# Patient Record
Sex: Male | Born: 1975
Health system: Southern US, Community
[De-identification: ages and names within clinical notes are randomized; demographics above are authoritative.]

## PROBLEM LIST (undated history)

## (undated) DIAGNOSIS — F32A Depression, unspecified: Secondary | ICD-10-CM

## (undated) DIAGNOSIS — F4310 Post-traumatic stress disorder, unspecified: Secondary | ICD-10-CM

## (undated) DIAGNOSIS — S0990XA Unspecified injury of head, initial encounter: Secondary | ICD-10-CM

## (undated) DIAGNOSIS — R109 Unspecified abdominal pain: Secondary | ICD-10-CM

## (undated) DIAGNOSIS — K219 Gastro-esophageal reflux disease without esophagitis: Secondary | ICD-10-CM

## (undated) DIAGNOSIS — G43909 Migraine, unspecified, not intractable, without status migrainosus: Secondary | ICD-10-CM

## (undated) DIAGNOSIS — R413 Other amnesia: Secondary | ICD-10-CM

## (undated) DIAGNOSIS — F329 Major depressive disorder, single episode, unspecified: Secondary | ICD-10-CM

## (undated) HISTORY — DX: Unspecified injury of head, initial encounter: S09.90XA

## (undated) HISTORY — DX: Migraine, unspecified, not intractable, without status migrainosus: G43.909

## (undated) HISTORY — DX: Major depressive disorder, single episode, unspecified: F32.9

## (undated) HISTORY — DX: Gastro-esophageal reflux disease without esophagitis: K21.9

## (undated) HISTORY — DX: Unspecified abdominal pain: R10.9

## (undated) HISTORY — DX: Post-traumatic stress disorder, unspecified: F43.10

## (undated) HISTORY — PX: UPPER GI ENDOSCOPY: SHX6162

## (undated) HISTORY — PX: DIAGNOSTIC LAPAROSCOPY: SUR761

## (undated) HISTORY — PX: COLONOSCOPY: SHX174

## (undated) HISTORY — DX: Depression, unspecified: F32.A

## (undated) HISTORY — DX: Other amnesia: R41.3

---

## 2011-07-23 DIAGNOSIS — R413 Other amnesia: Secondary | ICD-10-CM

## 2011-07-23 HISTORY — DX: Other amnesia: R41.3

## 2011-12-21 HISTORY — PX: ABDOMINAL SURGERY: SHX537

## 2013-03-22 ENCOUNTER — Emergency Department: Payer: Self-pay | Admitting: Emergency Medicine

## 2013-06-24 ENCOUNTER — Ambulatory Visit: Payer: Self-pay | Admitting: Gastroenterology

## 2013-06-28 ENCOUNTER — Ambulatory Visit: Payer: Self-pay | Admitting: Gastroenterology

## 2013-06-29 LAB — PATHOLOGY REPORT

## 2013-07-05 ENCOUNTER — Ambulatory Visit: Payer: Self-pay | Admitting: Gastroenterology

## 2013-07-29 DIAGNOSIS — N281 Cyst of kidney, acquired: Secondary | ICD-10-CM | POA: Insufficient documentation

## 2013-08-02 ENCOUNTER — Encounter: Payer: Self-pay | Admitting: *Deleted

## 2013-08-16 ENCOUNTER — Encounter: Payer: Self-pay | Admitting: General Surgery

## 2013-08-16 ENCOUNTER — Ambulatory Visit (INDEPENDENT_AMBULATORY_CARE_PROVIDER_SITE_OTHER): Payer: Medicaid Other | Admitting: General Surgery

## 2013-08-16 VITALS — BP 140/100 | HR 80 | Resp 14 | Ht 67.0 in | Wt 227.0 lb

## 2013-08-16 DIAGNOSIS — K458 Other specified abdominal hernia without obstruction or gangrene: Secondary | ICD-10-CM

## 2013-08-16 DIAGNOSIS — R109 Unspecified abdominal pain: Secondary | ICD-10-CM

## 2013-08-16 NOTE — Patient Instructions (Signed)
Patient to return as needed. Patient to be referred to Dr. Sherren Mocha Heniford.
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
## 2013-08-17 DIAGNOSIS — K458 Other specified abdominal hernia without obstruction or gangrene: Secondary | ICD-10-CM | POA: Insufficient documentation

## 2013-08-18 ENCOUNTER — Encounter: Payer: Self-pay | Admitting: *Deleted

## 2013-08-18 NOTE — Progress Notes (Signed)
Patient ID: Cody Harrison, male   DOB: May 05, 1976, 38 y.o.   MRN: [id number]  Patient has been scheduled for an appointment with Dr. Cheryln Manly at [facility] [phone]) for 09-01-13 at 10:30 am. Per Kenney Houseman at their office, patient has been seen by Dr. Milas Gain before (that physician works right with Dr. Sherren Mocha Henoford and may even help consult with the patient). Records have been forwarded for their review.   Kenney Houseman will contact the patient with date, time, and instructions. She will also ask patient to obtain a disc from CT scan and MRI that he had completed through [facility] in December.

## 2015-02-23 DIAGNOSIS — E669 Obesity, unspecified: Secondary | ICD-10-CM | POA: Diagnosis not present

## 2015-02-23 DIAGNOSIS — Z87828 Personal history of other (healed) physical injury and trauma: Secondary | ICD-10-CM | POA: Diagnosis not present

## 2015-02-23 DIAGNOSIS — Z6838 Body mass index (BMI) 38.0-38.9, adult: Secondary | ICD-10-CM | POA: Diagnosis not present

## 2015-02-23 DIAGNOSIS — I10 Essential (primary) hypertension: Secondary | ICD-10-CM | POA: Diagnosis not present

## 2015-02-23 DIAGNOSIS — K439 Ventral hernia without obstruction or gangrene: Secondary | ICD-10-CM | POA: Diagnosis not present

## 2015-02-26 DIAGNOSIS — K439 Ventral hernia without obstruction or gangrene: Secondary | ICD-10-CM | POA: Insufficient documentation

## 2015-02-26 DIAGNOSIS — E669 Obesity, unspecified: Secondary | ICD-10-CM | POA: Insufficient documentation

## 2016-03-28 ENCOUNTER — Encounter (HOSPITAL_COMMUNITY): Payer: Self-pay | Admitting: Family Medicine

## 2016-03-28 ENCOUNTER — Emergency Department (HOSPITAL_COMMUNITY)
Admission: EM | Admit: 2016-03-28 | Discharge: 2016-03-28 | Disposition: A | Discharge status: Home / Self Care | Payer: Medicare Other | Attending: Emergency Medicine | Admitting: Emergency Medicine

## 2016-03-28 DIAGNOSIS — Z5321 Procedure and treatment not carried out due to patient leaving prior to being seen by health care provider: Secondary | ICD-10-CM | POA: Insufficient documentation

## 2016-03-28 DIAGNOSIS — K469 Unspecified abdominal hernia without obstruction or gangrene: Secondary | ICD-10-CM | POA: Insufficient documentation

## 2016-03-28 LAB — COMPREHENSIVE METABOLIC PANEL
ALK PHOS: 86 U/L (ref 38–126)
ALT: 25 U/L (ref 17–63)
ANION GAP: 8 (ref 5–15)
AST: 21 U/L (ref 15–41)
Albumin: 3.9 g/dL (ref 3.5–5.0)
BILIRUBIN TOTAL: 0.9 mg/dL (ref 0.3–1.2)
BUN: 8 mg/dL (ref 6–20)
CALCIUM: 9.5 mg/dL (ref 8.9–10.3)
CO2: 24 mmol/L (ref 22–32)
CREATININE: 0.78 mg/dL (ref 0.61–1.24)
Chloride: 105 mmol/L (ref 101–111)
GFR calc non Af Amer: >60 mL/min (ref >60)
GLUCOSE: 95 mg/dL (ref 65–99)
Potassium: 4.2 mmol/L (ref 3.5–5.1)
Sodium: 137 mmol/L (ref 135–145)
TOTAL PROTEIN: 7.6 g/dL (ref 6.5–8.1)

## 2016-03-28 LAB — CBC
HCT: 48.4 % (ref 39.0–52.0)
HEMOGLOBIN: 17.1 g/dL — AB (ref 13.0–17.0)
MCH: 31.4 pg (ref 26.0–34.0)
MCHC: 35.3 g/dL (ref 30.0–36.0)
MCV: 89 fL (ref 78.0–100.0)
PLATELETS: 228 10*3/uL (ref 150–400)
RBC: 5.44 MIL/uL (ref 4.22–5.81)
RDW: 13 % (ref 11.5–15.5)
WBC: 10.3 10*3/uL (ref 4.0–10.5)

## 2016-03-28 LAB — LIPASE, BLOOD: Lipase: 28 U/L (ref 11–51)

## 2016-03-28 LAB — URINALYSIS, ROUTINE W REFLEX MICROSCOPIC
BILIRUBIN URINE: NEGATIVE
Glucose, UA: NEGATIVE mg/dL
Hgb urine dipstick: NEGATIVE
KETONES UR: NEGATIVE mg/dL
Leukocytes, UA: NEGATIVE
NITRITE: NEGATIVE
PROTEIN: NEGATIVE mg/dL
SPECIFIC GRAVITY, URINE: 1.022 (ref 1.005–1.030)
pH: 6.5 (ref 5.0–8.0)

## 2016-03-28 NOTE — ED Triage Notes (Signed)
Pt presents from home via POV with c/o hernias with significant pain worsening over the last 6-64mos.  Hernias are located in Left abdomen and groin and right abdomen and groin.  Tender to palpation, reports abdomen is distended.

## 2016-03-28 NOTE — ED Notes (Signed)
Pt choosing to leave, updated on wait, plan and process with rationale. Encouraged to "stay, and/or return if: changes mind, or develops new or worsening sx". States, "Will return in the morning". Pt alert, NAD, calm, interactive, resps e/u, no dyspnea noted. Steady gait.

## 2016-03-29 ENCOUNTER — Emergency Department
Admission: EM | Admit: 2016-03-29 | Discharge: 2016-03-29 | Disposition: A | Discharge status: Home / Self Care | Payer: Medicare Other | Attending: Emergency Medicine | Admitting: Emergency Medicine

## 2016-03-29 ENCOUNTER — Encounter: Payer: Self-pay | Admitting: Radiology

## 2016-03-29 ENCOUNTER — Emergency Department: Payer: Medicare Other

## 2016-03-29 DIAGNOSIS — R1030 Lower abdominal pain, unspecified: Secondary | ICD-10-CM

## 2016-03-29 DIAGNOSIS — K460 Unspecified abdominal hernia with obstruction, without gangrene: Secondary | ICD-10-CM | POA: Insufficient documentation

## 2016-03-29 DIAGNOSIS — R110 Nausea: Secondary | ICD-10-CM | POA: Insufficient documentation

## 2016-03-29 DIAGNOSIS — K469 Unspecified abdominal hernia without obstruction or gangrene: Secondary | ICD-10-CM | POA: Diagnosis not present

## 2016-03-29 DIAGNOSIS — F17220 Nicotine dependence, chewing tobacco, uncomplicated: Secondary | ICD-10-CM | POA: Insufficient documentation

## 2016-03-29 DIAGNOSIS — R1032 Left lower quadrant pain: Secondary | ICD-10-CM | POA: Diagnosis not present

## 2016-03-29 LAB — URINALYSIS COMPLETE WITH MICROSCOPIC (ARMC ONLY)
BACTERIA UA: NONE SEEN
BILIRUBIN URINE: NEGATIVE
Glucose, UA: NEGATIVE mg/dL
HGB URINE DIPSTICK: NEGATIVE
Ketones, ur: NEGATIVE mg/dL
Leukocytes, UA: NEGATIVE
Nitrite: NEGATIVE
PH: 6 (ref 5.0–8.0)
Protein, ur: NEGATIVE mg/dL
SQUAMOUS EPITHELIAL / LPF: NONE SEEN
Specific Gravity, Urine: 1.019 (ref 1.005–1.030)
WBC, UA: NONE SEEN WBC/hpf (ref 0–5)

## 2016-03-29 LAB — CBC
HEMATOCRIT: 47.8 % (ref 40.0–52.0)
Hemoglobin: 16.8 g/dL (ref 13.0–18.0)
MCH: 31.1 pg (ref 26.0–34.0)
MCHC: 35.1 g/dL (ref 32.0–36.0)
MCV: 88.7 fL (ref 80.0–100.0)
PLATELETS: 227 10*3/uL (ref 150–440)
RBC: 5.39 MIL/uL (ref 4.40–5.90)
RDW: 13.2 % (ref 11.5–14.5)
WBC: 10.4 10*3/uL (ref 3.8–10.6)

## 2016-03-29 LAB — COMPREHENSIVE METABOLIC PANEL
ALT: 22 U/L (ref 17–63)
AST: 22 U/L (ref 15–41)
Albumin: 4 g/dL (ref 3.5–5.0)
Alkaline Phosphatase: 84 U/L (ref 38–126)
Anion gap: 7 (ref 5–15)
BUN: 13 mg/dL (ref 6–20)
CHLORIDE: 105 mmol/L (ref 101–111)
CO2: 24 mmol/L (ref 22–32)
CREATININE: 0.74 mg/dL (ref 0.61–1.24)
Calcium: 9.2 mg/dL (ref 8.9–10.3)
GFR calc Af Amer: >60 mL/min (ref >60)
GFR calc non Af Amer: >60 mL/min (ref >60)
Glucose, Bld: 118 mg/dL — ABNORMAL HIGH (ref 65–99)
POTASSIUM: 4.1 mmol/L (ref 3.5–5.1)
SODIUM: 136 mmol/L (ref 135–145)
Total Bilirubin: 0.6 mg/dL (ref 0.3–1.2)
Total Protein: 7.7 g/dL (ref 6.5–8.1)

## 2016-03-29 LAB — LIPASE, BLOOD: LIPASE: 63 U/L — AB (ref 11–51)

## 2016-03-29 MED ORDER — METOCLOPRAMIDE HCL 10 MG PO TABS: 10.0000 mg | ORAL_TABLET | Freq: Four times a day (QID) | ORAL | 0 refills | Status: DC | PRN

## 2016-03-29 MED ORDER — IOPAMIDOL (ISOVUE-300) INJECTION 61%
100.0000 mL | Freq: Once | INTRAVENOUS | Status: AC | PRN
  Administered 2016-03-29: 100 mL via INTRAVENOUS
  Filled 2016-03-29: qty 100

## 2016-03-29 MED ORDER — ONDANSETRON HCL 4 MG/2ML IJ SOLN
4.0000 mg | Freq: Once | INTRAMUSCULAR | Status: AC
  Administered 2016-03-29: 4 mg via INTRAVENOUS
  Filled 2016-03-29: qty 2

## 2016-03-29 MED ORDER — SODIUM CHLORIDE 0.9 % IV BOLUS (SEPSIS)
1000.0000 mL | Freq: Once | INTRAVENOUS | Status: AC
  Administered 2016-03-29: 1000 mL via INTRAVENOUS

## 2016-03-29 MED ORDER — IOPAMIDOL (ISOVUE-300) INJECTION 61%
30.0000 mL | Freq: Once | INTRAVENOUS | Status: AC | PRN
  Administered 2016-03-29: 30 mL via ORAL
  Filled 2016-03-29: qty 30

## 2016-03-29 NOTE — ED Notes (Signed)

## 2016-03-29 NOTE — ED Triage Notes (Signed)
Presents with family from home with c/o hernias  For the past 6 mos or so   States pain is worsening to left side of abd   Positive nausea  No vomiting  Or fever
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
## 2016-03-29 NOTE — ED Notes (Signed)
Patient transported to CT 

## 2016-04-04 ENCOUNTER — Ambulatory Visit (INDEPENDENT_AMBULATORY_CARE_PROVIDER_SITE_OTHER): Payer: Medicare Other | Admitting: General Surgery

## 2016-04-04 ENCOUNTER — Telehealth: Payer: Self-pay

## 2016-04-04 ENCOUNTER — Encounter: Payer: Self-pay | Admitting: General Surgery

## 2016-04-04 VITALS — BP 150/83 | HR 66 | Temp 97.8°F | Ht 67.0 in | Wt 248.2 lb

## 2016-04-04 DIAGNOSIS — N281 Cyst of kidney, acquired: Secondary | ICD-10-CM

## 2016-04-04 DIAGNOSIS — K458 Other specified abdominal hernia without obstruction or gangrene: Secondary | ICD-10-CM

## 2016-04-04 DIAGNOSIS — Q6100 Congenital renal cyst, unspecified: Secondary | ICD-10-CM | POA: Diagnosis not present

## 2016-04-04 MED ORDER — GABAPENTIN 300 MG PO CAPS: 300.0000 mg | ORAL_CAPSULE | Freq: Three times a day (TID) | ORAL | 0 refills | Status: DC
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
## 2016-04-08 NOTE — Telephone Encounter (Signed)
I have sent both referrals through [facility].   I have contacted the pain clinic and they are scheduling out New Patient's in December.   I will follow up on appointments.

## 2016-04-11 ENCOUNTER — Encounter: Payer: Self-pay | Admitting: Urology

## 2016-04-11 ENCOUNTER — Ambulatory Visit (INDEPENDENT_AMBULATORY_CARE_PROVIDER_SITE_OTHER): Payer: Medicare Other | Admitting: Urology

## 2016-04-11 VITALS — BP 132/85 | HR 63 | Ht 67.0 in | Wt 248.0 lb

## 2016-04-11 DIAGNOSIS — N2889 Other specified disorders of kidney and ureter: Secondary | ICD-10-CM
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
## 2016-04-23 ENCOUNTER — Ambulatory Visit (INDEPENDENT_AMBULATORY_CARE_PROVIDER_SITE_OTHER): Payer: Medicare Other | Admitting: Family Medicine

## 2016-04-23 ENCOUNTER — Encounter: Payer: Self-pay | Admitting: Family Medicine

## 2016-04-23 VITALS — BP 124/74 | HR 80 | Temp 98.3°F | Resp 16 | Ht 67.0 in | Wt 245.8 lb

## 2016-04-23 DIAGNOSIS — Z23 Encounter for immunization: Secondary | ICD-10-CM | POA: Diagnosis not present

## 2016-04-23 DIAGNOSIS — D492 Neoplasm of unspecified behavior of bone, soft tissue, and skin: Secondary | ICD-10-CM | POA: Insufficient documentation

## 2016-04-23 DIAGNOSIS — E669 Obesity, unspecified: Secondary | ICD-10-CM

## 2016-04-23 DIAGNOSIS — G8928 Other chronic postprocedural pain: Secondary | ICD-10-CM | POA: Insufficient documentation

## 2016-04-23 DIAGNOSIS — G4700 Insomnia, unspecified: Secondary | ICD-10-CM | POA: Insufficient documentation

## 2016-04-23 DIAGNOSIS — R7309 Other abnormal glucose: Secondary | ICD-10-CM

## 2016-04-23 DIAGNOSIS — Z13220 Encounter for screening for lipoid disorders: Secondary | ICD-10-CM

## 2016-04-23 DIAGNOSIS — I10 Essential (primary) hypertension: Secondary | ICD-10-CM | POA: Insufficient documentation

## 2016-04-23 DIAGNOSIS — F4310 Post-traumatic stress disorder, unspecified: Secondary | ICD-10-CM

## 2016-04-23 DIAGNOSIS — Z131 Encounter for screening for diabetes mellitus: Secondary | ICD-10-CM | POA: Diagnosis not present

## 2016-04-23 DIAGNOSIS — D229 Melanocytic nevi, unspecified: Secondary | ICD-10-CM

## 2016-04-23 DIAGNOSIS — F418 Other specified anxiety disorders: Secondary | ICD-10-CM

## 2016-04-23 DIAGNOSIS — G4701 Insomnia due to medical condition: Secondary | ICD-10-CM

## 2016-04-23 DIAGNOSIS — R030 Elevated blood-pressure reading, without diagnosis of hypertension: Secondary | ICD-10-CM | POA: Insufficient documentation

## 2016-04-23 DIAGNOSIS — K439 Ventral hernia without obstruction or gangrene: Secondary | ICD-10-CM

## 2016-04-23 DIAGNOSIS — F1021 Alcohol dependence, in remission: Secondary | ICD-10-CM

## 2016-04-23 MED ORDER — TRAZODONE HCL 150 MG PO TABS: 150.0000 mg | ORAL_TABLET | Freq: Every day | ORAL | 2 refills | Status: DC

## 2016-04-23 MED ORDER — BACLOFEN 10 MG PO TABS: 5.0000 mg | ORAL_TABLET | Freq: Three times a day (TID) | ORAL | 2 refills | Status: DC | PRN

## 2016-04-23 MED ORDER — CITALOPRAM HYDROBROMIDE 40 MG PO TABS: 40.0000 mg | ORAL_TABLET | Freq: Every day | ORAL | 2 refills | Status: DC

## 2016-04-23 NOTE — Assessment & Plan Note (Signed)
Elevated BMI, and prior abnormal glucose. Fam history DM. Chronic HTN vs Pre-HTN Future fasting lipid ordered and A1c screening.

## 2016-04-23 NOTE — Assessment & Plan Note (Signed)
Stable, post-op incisional hernia abdominal wall. Previously followed by various Bariatric surgeons and General Surgery, no plans for surgery at this time. No indications of worsening or new problem. Follow-up as needed.

## 2016-04-23 NOTE — Patient Instructions (Signed)
Thank you for coming in to clinic today.  1. For Chronic Pain - Start taking Gabapentin 300mg  capsules - twice a day for several days, then if tolerated and not too sedated, increase to 1 capsules 3 times a day - You may follow-up with Dr Adonis Huguenin if you want  Still pending referral to Dr Milinda Pointer, MD - call their office to find out more info, this takes a while usually [facility] [address], [place], [place] [address] Main: [phone]  Recommend to start taking Tylenol Extra Strength 500mg  tabs - take 1 to 2 tabs (max 1000mg  per dose) every 6 hours for pain (take regularly, don't skip a dose for next 3-7 days), max 24 hour daily dose is 6 to 8 tablets or 3000 to 4000mg   Start taking Baclofen (Lioresal) 10mg  (muscle relaxant) - start with half to one pill at night as needed for next 1-3 nights (may make you drowsy, caution with driving) see how it affects you, then if tolerated increase to one pill 2 to 3 times a day or (every 8 hours as needed)  May try topical icy hot or other medications, heating pad or ice packs  2. Blood pressure is okay today, no new medications. - Check outside BP at pharmacy or drug store or at home, write down readings and bring to next visit  3. Referral to Dermatology for skin tag removal and mole evaluation  [facility] - Dr. Ree Edman   [address] [place], [place] [address] Phone: [phone]  Received TDap and FLu shots today  In next 2-3 weeks... - Please schedule a "Lab Only" visit (early morning, 8:00am to 9:00am) to get your blood work drawn here at our clinic. - You need to be fasting (No food or drink after midnight, and nothing in the morning before your blood draw). - I have already ordered your blood work, so you may schedule this appointment at your convenience  For Lab Results, once available within next 2-3 days, you can log in to [facility] to  view your results and a brief message with explanations. Otherwise, our office will contact you by phone within 1 week. OR we can discuss results at your next follow-up visit.   Please schedule a follow-up appointment with Dr. Parks Ranger in 4 to 6 weeks to follow-up BP, DM screening, Chronic Pain, Depression/Anxiety  If you have any other questions or concerns, please feel free to call the clinic or send a message through [place]. You may also schedule an earlier appointment if necessary.  Nobie Putnam, DO [place]
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
## 2016-04-23 NOTE — Assessment & Plan Note (Signed)
R axilla/chest wall, appears almost wart-like vs pedunculated growth. Abnormal appearance at tip with perhaps vasculature. - Referral to Dermatology for excision and pathology
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
## 2016-04-23 NOTE — Assessment & Plan Note (Signed)
Initial borderline elevated, prior mild elevated values. Re-check improved. No prior anti-HTN therapy Hold new start med today. Advised to monitor BP outside office, record readings, bring to next visit. Consider low dose med in near future.

## 2016-04-23 NOTE — Assessment & Plan Note (Signed)
Stable, sober from alcohol >2 months, s/p inpatient medical detox. No history of complicated withdrawal symptoms. Encouraged to stay alcohol free, has good support Follow-up as needed

## 2016-04-23 NOTE — Assessment & Plan Note (Signed)
Two atypical moles on mid back Referral to Dermatology, given other skin abnormalities

## 2016-04-23 NOTE — Assessment & Plan Note (Signed)
Secondary to anxiety/depression also chronic post-op pain. Expect improvement now sober from alcohol x 2 months, may take time. Increase Trazodone from 100 to 150mg  at night for now. Continue Celexa and treat underlying pain.

## 2016-05-09 ENCOUNTER — Other Ambulatory Visit: Payer: Self-pay | Admitting: *Deleted

## 2016-05-09 ENCOUNTER — Other Ambulatory Visit: Payer: Medicare Other

## 2016-05-09 DIAGNOSIS — E669 Obesity, unspecified: Secondary | ICD-10-CM | POA: Diagnosis not present

## 2016-05-09 DIAGNOSIS — R7309 Other abnormal glucose: Secondary | ICD-10-CM

## 2016-05-09 DIAGNOSIS — Z13220 Encounter for screening for lipoid disorders: Secondary | ICD-10-CM | POA: Diagnosis not present

## 2016-05-09 DIAGNOSIS — Z131 Encounter for screening for diabetes mellitus: Secondary | ICD-10-CM

## 2016-05-10 ENCOUNTER — Encounter: Payer: Self-pay | Admitting: Family Medicine

## 2016-05-10 DIAGNOSIS — E1169 Type 2 diabetes mellitus with other specified complication: Secondary | ICD-10-CM | POA: Insufficient documentation

## 2016-05-10 DIAGNOSIS — R7303 Prediabetes: Secondary | ICD-10-CM | POA: Insufficient documentation

## 2016-05-10 LAB — LIPID PANEL
Cholesterol: 211 mg/dL — ABNORMAL HIGH (ref 125–200)
HDL: 39 mg/dL — AB (ref >=40)
LDL CALC: 139 mg/dL — AB (ref <130)
Total CHOL/HDL Ratio: 5.4 Ratio — ABNORMAL HIGH (ref <=5.0)
Triglycerides: 164 mg/dL — ABNORMAL HIGH (ref <150)
VLDL: 33 mg/dL — ABNORMAL HIGH (ref <30)

## 2016-05-10 LAB — HEMOGLOBIN A1C
HEMOGLOBIN A1C: 6.3 % — AB (ref <5.7)
Mean Plasma Glucose: 134 mg/dL

## 2016-05-28 ENCOUNTER — Encounter: Payer: Self-pay | Admitting: Family Medicine

## 2016-05-28 ENCOUNTER — Ambulatory Visit (INDEPENDENT_AMBULATORY_CARE_PROVIDER_SITE_OTHER): Payer: Medicare Other | Admitting: Family Medicine

## 2016-05-28 VITALS — BP 138/69 | HR 74 | Temp 98.6°F | Resp 16 | Ht 67.0 in

## 2016-05-28 DIAGNOSIS — I10 Essential (primary) hypertension: Secondary | ICD-10-CM | POA: Diagnosis not present

## 2016-05-28 DIAGNOSIS — F418 Other specified anxiety disorders: Secondary | ICD-10-CM | POA: Diagnosis not present

## 2016-05-28 DIAGNOSIS — R7303 Prediabetes: Secondary | ICD-10-CM | POA: Diagnosis not present

## 2016-05-28 DIAGNOSIS — G8928 Other chronic postprocedural pain: Secondary | ICD-10-CM | POA: Diagnosis not present

## 2016-05-28 DIAGNOSIS — F4310 Post-traumatic stress disorder, unspecified: Secondary | ICD-10-CM

## 2016-05-28 MED ORDER — METFORMIN HCL 500 MG PO TABS: 500.0000 mg | ORAL_TABLET | Freq: Two times a day (BID) | ORAL | 2 refills | Status: DC

## 2016-05-28 MED ORDER — DULOXETINE HCL 30 MG PO CPEP: 30.0000 mg | ORAL_CAPSULE | Freq: Every day | ORAL | 1 refills | Status: DC

## 2016-05-28 MED ORDER — GABAPENTIN 600 MG PO TABS: 600.0000 mg | ORAL_TABLET | Freq: Three times a day (TID) | ORAL | 0 refills | Status: DC
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
## 2016-05-28 NOTE — Patient Instructions (Addendum)
Thank you for coming in to clinic today.  A1c 6.3, this is pre Diabetes - Start Metformin 500mg  with dinner for 1 week, it may cause upset stomach and diarrhea, you will eventually get used to this as body adjusts, it may take a few weeks though, once tolerated, you can increase to 1 pill twice a day with food. In future max dose is 2 pills twice a day, we will discuss this if we need to - Try to work on low carb diet / regular exercise  Taper off Celexa - take one whole tab 40mg  every OTHER day for next 1 week  Once off Celexa, start Duloxetine (we will work on [employer], they will likely give you a temporary 30 day supply first), then take this once daily every day for next 4 weeks  Discontinue Trazodone  For Chronic Pain - Resume gabapentin 300mg  in morning and afternoon, and take new rx Gabapentin 600mg  tab at NIGHT, do this for several days to 1 week if tolerated well, not too sedated, you can increase up to 300mg  morning, 600mg  afternoon and 600mg  night, eventually after 1-2 weeks work your way up to 600mg  tablet 3 times a day - Duloxetine (Cymbalta) should help with chronic pain and insomnia as well  Recommend to start taking Tylenol Extra Strength 500mg  tabs - take 1 to 2 tabs (max 1000mg  per dose) every 6 hours for pain (take regularly, don't skip a dose for next 3-7 days), max 24 hour daily dose is 6 to 8 tablets or 3000 to 4000mg   2. Blood pressure is okay today, no new medications. - Keep checking BP, few times a week is fine, plan to bring these readings and your cuff to your visit in 3 MONTHS, not next visit  Diet Recommendations for Pre / Diabetes   Starchy (carb) foods include: Bread, rice, pasta, potatoes, corn, crackers, bagels, muffins, all baked goods.   Protein foods include: Meat, fish, poultry, eggs, dairy foods, and beans such as pinto and kidney beans (beans also provide carbohydrate).   1. Eat at least 3 meals and 1-2 snacks per day. Never go  more than 4-5 hours while awake without eating.   2. Limit starchy foods to TWO per meal and ONE per snack. ONE portion of a starchy  food is equal to the following:   - ONE slice of bread (or its equivalent, such as half of a hamburger bun).   - 1/2 cup of a "scoopable" starchy food such as potatoes or rice.   - 1 OUNCE (28 grams) of starchy snacks (crackers or pretzels, look on label).   - 15 grams of carbohydrate as shown on food label.   3. Both lunch and dinner should include a protein food, a carb food, and vegetables.   - Obtain twice as many veg's as protein or carbohydrate foods for both lunch and dinner.   - Try to keep frozen veg's on hand for a quick vegetable serving.     - Fresh or frozen veg's are best.   4. Breakfast should always include protein.      Please schedule a follow-up appointment with Dr. Parks Ranger in 4 weeks to follow-up Anxiety/Depression Medication, and also 3 Months from today for Pre-Diabetes (A1c), BP  If you have any other questions or concerns, please feel free to call the clinic or send a message through [place]. You may also schedule an earlier appointment if necessary.  Nobie Putnam, DO [facility],  [facility]

## 2016-05-28 NOTE — Assessment & Plan Note (Signed)
BP within normal range today, concern with chronic pain raising BP and home BP checks elevated.  Plan: 1. Continue to hold off on new start anti-HTN therapy today 2. Monitor BP at home, write down logs, bring to visit in 3 months with BP cuff for calibration, discussed most likely will need to start low dose therapy, possibly ACEi with Pre-DM

## 2016-05-29 NOTE — Assessment & Plan Note (Signed)
New diagnosis Pre-DM Counseling on DM diet and lifestyle modifications Start Metformin titration 500mg  nightly 1 week or longer as tolerated (counseled on GI intolerance), goal up to 500mg  BID when follow-up Check A1c 3 months, future consider [place] for education
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
## 2016-05-29 NOTE — Assessment & Plan Note (Signed)
Stable, without worsening See A&P for anxiety/depression Taper off Celexa, start Duloxetine, may need to dose titrate next visit Follow-up 4 weeks, consider duloxetine 60, and psych

## 2016-06-25 ENCOUNTER — Encounter: Payer: Self-pay | Admitting: Family Medicine

## 2016-06-25 ENCOUNTER — Ambulatory Visit (INDEPENDENT_AMBULATORY_CARE_PROVIDER_SITE_OTHER): Payer: Medicare Other | Admitting: Family Medicine

## 2016-06-25 VITALS — BP 135/71 | HR 82 | Temp 97.9°F | Resp 16 | Ht 67.0 in | Wt 243.0 lb

## 2016-06-25 DIAGNOSIS — G4701 Insomnia due to medical condition: Secondary | ICD-10-CM

## 2016-06-25 DIAGNOSIS — R030 Elevated blood-pressure reading, without diagnosis of hypertension: Secondary | ICD-10-CM | POA: Diagnosis not present

## 2016-06-25 DIAGNOSIS — F1021 Alcohol dependence, in remission: Secondary | ICD-10-CM

## 2016-06-25 DIAGNOSIS — F4310 Post-traumatic stress disorder, unspecified: Secondary | ICD-10-CM | POA: Diagnosis not present

## 2016-06-25 DIAGNOSIS — G8928 Other chronic postprocedural pain: Secondary | ICD-10-CM | POA: Diagnosis not present

## 2016-06-25 DIAGNOSIS — R7303 Prediabetes: Secondary | ICD-10-CM | POA: Diagnosis not present

## 2016-06-25 DIAGNOSIS — F418 Other specified anxiety disorders: Secondary | ICD-10-CM | POA: Diagnosis not present

## 2016-06-25 MED ORDER — DULOXETINE HCL 60 MG PO CPEP: 60.0000 mg | ORAL_CAPSULE | Freq: Every day | ORAL | 5 refills | Status: DC

## 2016-06-25 MED ORDER — GABAPENTIN 600 MG PO TABS: 600.0000 mg | ORAL_TABLET | Freq: Three times a day (TID) | ORAL | 1 refills | Status: DC

## 2016-06-25 NOTE — Assessment & Plan Note (Signed)
Improved to stable, without worsening. See A&P Depression/Anxiety Off Celexa Increased Duloxetine to 60mg  daily

## 2016-06-25 NOTE — Assessment & Plan Note (Signed)
Slightly elevated SBP, only in Pre-HTN range, not consistent with HTN. Reviewed home BP log avg 120s/70s, not consistent with HTN  Plan: 1. Reassurance, unlikely dx HTN, likely Pre-HTN 2. No anti-HTN therapy today 3. Encouraged continue lifestyle improvements 4. Follow-up 6 weeks re-check, monitor BP outside, consider low dose ACEi if needed if any worsening blood sugar
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
## 2016-06-25 NOTE — Assessment & Plan Note (Addendum)
Improved on Duloxetine, secondary to anxiety/depression also chronic post-op pain. - Remains sober since 02/2016, also helping - Off trazodone, ineffective  Plan: 1. Increase Duloxetine to 60mg  daily 2. Follow-up
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
## 2016-06-25 NOTE — Assessment & Plan Note (Signed)
No change today, reviewed plan Continue Metformin 500 BID, since tolerating well, decision to not titrate at this time Encouraged lifestyle modifications Follow-up 6 weeks for Pre-DM A1c, consider future [place] referral if needed
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
## 2016-06-25 NOTE — Patient Instructions (Addendum)
Thank you for coming in to clinic today.  Continue same dose Metformin 500mg  twice a week with food. In future, if A1c is elevated then we can discuss increasing this dose as needed. - Keep up good work on low carb diet and regular exercise  I am encouraged that your symptoms are starting to improve with the Duloxetine, it may take 2 to 6 weeks or more to get even more benefit after changing dose  Increase Duloxetine to 60mg  daily - you can take 2 of the old 30mg  capsules, OR go ahead and start NEW rx Duloxetine 60mg  daily, given refills, this is max dose, stay at this dose once daily every day for next several months, we may need to continue this long-term if helping.  For Chronic Pain - Continue Gabapentin 600mg  3 times a day, slowly after next 1-2 weeks you can start increasing to ONE and  HALF tablets = 900mg  per dose, do this for NIGHT-time dose first, for several days, then slowly go up to 900mg  3 times a day - IF STILL NOT Helping then we will discuss switch to Lyrica in future  Recommend to start taking Tylenol Extra Strength 500mg  tabs - take 1 to 2 tabs (max 1000mg  per dose) every 6 hours for pain (take regularly, don't skip a dose for next 3-7 days), max 24 hour daily dose is 6 to 8 tablets or 3000 to 4000mg   Thanks for checking BP, readings look good. You do not have hypertension at this time, if you can check Bp at pharmacy whenever you go or whenever at [place], write these numbers down  Please schedule a follow-up appointment with Dr. Parks Ranger in 6 weeks after 08/09/16 for Pre-Diabetes re-check, Anxiety Depression GAD/PHQ  If you have any other questions or concerns, please feel free to call the clinic or send a message through [place]. You may also schedule an earlier appointment if necessary.  Nobie Putnam, DO [place]

## 2016-08-12 ENCOUNTER — Other Ambulatory Visit: Payer: Medicare Other

## 2016-08-12 ENCOUNTER — Other Ambulatory Visit (INDEPENDENT_AMBULATORY_CARE_PROVIDER_SITE_OTHER): Payer: Medicare Other | Admitting: Family Medicine

## 2016-08-12 DIAGNOSIS — R7303 Prediabetes: Secondary | ICD-10-CM

## 2016-08-12 LAB — HEMOGLOBIN A1C
HEMOGLOBIN A1C: 5.3 % (ref <5.7)
MEAN PLASMA GLUCOSE: 105 mg/dL

## 2016-08-13 LAB — POCT UA - MICROALBUMIN: Microalbumin Ur, POC: 0 mg/L

## 2016-08-14 ENCOUNTER — Encounter: Payer: Self-pay | Admitting: Family Medicine

## 2016-08-14 ENCOUNTER — Ambulatory Visit (INDEPENDENT_AMBULATORY_CARE_PROVIDER_SITE_OTHER): Payer: Medicare Other | Admitting: Family Medicine

## 2016-08-14 VITALS — BP 136/78 | HR 72 | Temp 97.7°F | Resp 16 | Ht 67.0 in | Wt 214.0 lb

## 2016-08-14 DIAGNOSIS — F4310 Post-traumatic stress disorder, unspecified: Secondary | ICD-10-CM | POA: Diagnosis not present

## 2016-08-14 DIAGNOSIS — R7303 Prediabetes: Secondary | ICD-10-CM

## 2016-08-14 DIAGNOSIS — G4701 Insomnia due to medical condition: Secondary | ICD-10-CM | POA: Diagnosis not present

## 2016-08-14 DIAGNOSIS — E669 Obesity, unspecified: Secondary | ICD-10-CM

## 2016-08-14 DIAGNOSIS — R030 Elevated blood-pressure reading, without diagnosis of hypertension: Secondary | ICD-10-CM

## 2016-08-14 DIAGNOSIS — F1021 Alcohol dependence, in remission: Secondary | ICD-10-CM

## 2016-08-14 DIAGNOSIS — F418 Other specified anxiety disorders: Secondary | ICD-10-CM | POA: Diagnosis not present

## 2016-08-14 MED ORDER — DULOXETINE HCL 60 MG PO CPEP: ORAL_CAPSULE | ORAL | 5 refills | Status: DC

## 2016-08-14 MED ORDER — DULOXETINE HCL 30 MG PO CPEP: ORAL_CAPSULE | ORAL | 5 refills | Status: DC

## 2016-08-14 NOTE — Patient Instructions (Signed)
Thank you for coming in to clinic today.  1. - Increase Duloxetine to total 90mg  nightly, take one of the 60mg  capsules and one of the 30mg  capsules same time every night - Let me know if difficulty obtaining both rx from pharmacy due to separate dosages, increase alternatively to TWO of the 60s and will send higher pill count - Discussed other options such as Remeron (Mirtazapine), Buspar (Buspirone), Wellbutrin, please look into these options in the future if needed as add on therapy  BP improved today, keep checking and write down readings, if persistently elevated >140/90 then contact office sooner and we can re-discuss  Congratulations on the low A1c and weight loss, keep up the good work - In future consider starting some regular exercise with walking or other to help control BP  Please call to give update in 4 weeks about the Duloxetine.  Please schedule a follow-up appointment with Dr. Parks Ranger in 6 months for Pre-Diabetes A1c check, or if worsening if you have weight gain or other problems, you can return sooner like 3 months   If you have any other questions or concerns, please feel free to call the clinic or send a message through [place]. You may also schedule an earlier appointment if necessary.  Nobie Putnam, DO [place]

## 2016-08-14 NOTE — Assessment & Plan Note (Addendum)
Initial BP elevated, repeat BP improved, SBP still slightly elevated. However home logs have been all < 140/90  Plan: 1. Reassurance still, monitor BP outside office, notify us if persistent elevated BP >140/90 2. Encouraged regular exercise in addition to continuing current healthier diet, limited soda. BP should continue to improve with weight loss 3. Follow-up 6 months given overall doing well, return sooner 3 mo if needed. Will discuss consider low dose ACEi in future
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
## 2016-08-14 NOTE — Assessment & Plan Note (Signed)
Significant improvement A1c 5.3 (from 6.3) with assoc 30 lb wt loss in 2 months due to improved lifestyle (limited soda) and metformin - Recent urine microalbumin negative (0)  Plan: 1. Continue Metformin 500mg  BID 2. Encouraged continue lifestyle changes with diet, limited soda, also encouraged to start some regular exercise to help with weight, control BP and sugar 3. Follow-up 6 months A1c, BP, consider adding low dose ACEi may help BP also renal protection despite normal kidney health currently. Consider future [facility]
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
## 2016-08-14 NOTE — Assessment & Plan Note (Signed)
Stable to improved. Increase Duloxetine from 60 to 90, see A&P

## 2016-08-14 NOTE — Assessment & Plan Note (Signed)
Stable, remains sober alcohol free since 02/2016 - Attributes some weight loss due to no more beer intake - Encouraged remain alcohol free

## 2016-08-14 NOTE — Assessment & Plan Note (Signed)
Stable to improved, still few nights weekly difficulty with insomnia, improved on higher dose self titrated duloxetine 60 to 120 - See A&P anxiety - Increase Duloxetine 60 to 90

## 2016-08-14 NOTE — Assessment & Plan Note (Signed)
Significant weight loss, 30 lb in 2 months, modified diet with limited soda, still no regular exercise. - Encouraged continue lifestyle, start regular exercise

## 2016-10-11 ENCOUNTER — Other Ambulatory Visit: Payer: Self-pay | Admitting: Family Medicine

## 2016-10-11 DIAGNOSIS — R7303 Prediabetes: Secondary | ICD-10-CM

## 2016-10-11 MED ORDER — METFORMIN HCL 500 MG PO TABS: 500.0000 mg | ORAL_TABLET | Freq: Two times a day (BID) | ORAL | 3 refills | Status: DC

## 2016-11-11 ENCOUNTER — Telehealth: Payer: Self-pay | Admitting: Family Medicine

## 2016-11-11 DIAGNOSIS — G8928 Other chronic postprocedural pain: Secondary | ICD-10-CM

## 2016-11-11 MED ORDER — GABAPENTIN 600 MG PO TABS: 600.0000 mg | ORAL_TABLET | Freq: Three times a day (TID) | ORAL | 11 refills | Status: DC

## 2016-11-11 NOTE — Telephone Encounter (Signed)
Pt needs a refill on gabapentin sent to [facility].  His call back number is [phone]

## 2016-11-11 NOTE — Telephone Encounter (Signed)
Refilled Gabapentin for dose 1 to 1.5 tabs of 600mg  TID, #135 tabs +11 refills to [facility] 11/11/2016, 1:51 PM

## 2017-03-12 ENCOUNTER — Encounter: Payer: Self-pay | Admitting: Family Medicine

## 2017-03-12 ENCOUNTER — Ambulatory Visit (INDEPENDENT_AMBULATORY_CARE_PROVIDER_SITE_OTHER): Payer: Medicare Other | Admitting: Family Medicine

## 2017-03-12 VITALS — BP 169/81 | HR 94 | Temp 98.6°F | Resp 16 | Ht 67.0 in | Wt 252.6 lb

## 2017-03-12 DIAGNOSIS — F4310 Post-traumatic stress disorder, unspecified: Secondary | ICD-10-CM | POA: Diagnosis not present

## 2017-03-12 DIAGNOSIS — F418 Other specified anxiety disorders: Secondary | ICD-10-CM

## 2017-03-12 DIAGNOSIS — G4701 Insomnia due to medical condition: Secondary | ICD-10-CM

## 2017-03-12 DIAGNOSIS — D492 Neoplasm of unspecified behavior of bone, soft tissue, and skin: Secondary | ICD-10-CM

## 2017-03-12 MED ORDER — QUETIAPINE FUMARATE 50 MG PO TABS: 25.0000 mg | ORAL_TABLET | Freq: Every day | ORAL | 1 refills | Status: DC

## 2017-03-12 NOTE — Patient Instructions (Addendum)
Thank you for coming to the clinic today.  1.  Start taking Seroquel 25mg  (HALF tab) nightly for now, after 1 week if not helping increase to 1 whole pill for 50mg  nightly, then after 2 more weeks if not helping can increase to 2 of the 50mg  pills for 100mg   Referred you to [facility] in [place], stay tuned for appointment  [facility] [place], [place] [address] Phone: [phone]  PSYCHIATRY / [place]  Self Referral  [facility] (All ages) [address], [place], [id number] Phone: [phone] (Option 1) [email or web address] Dr Myer Haff  [facility]) [place] [address], [place], [place] [address] Phone: [phone]  [employer], available walk-in 9am-4pm M-F [place], [place] [address] Hours: [place] (M-F, walk in available) [phone]   Address: [place], [place], [place] [address] Hours: 8AM-5PM (accepts walk in to establish) Phone: [phone]   Please schedule a Follow-up Appointment to: Return in about 6 weeks (around 04/23/2017) for insomnia.  If you have any other questions or concerns, please feel free to call the clinic or send a message through [place]. You may also schedule an earlier appointment if necessary.  Additionally, you may be receiving a survey about your experience at our clinic within a few days to 1 week by e-mail or mail. We value your feedback.  Nobie Putnam, DO [place]
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
## 2017-03-13 NOTE — Assessment & Plan Note (Signed)
Seems to be worse now See A&P for insomnia for management

## 2017-03-13 NOTE — Assessment & Plan Note (Signed)
Unchanged stable, R axilla/chest wall large pedunculated growth  Further chart review shows prior referral to [facility] 04/23/16 was ultimately closed due to "no insurance", however patient has medicare ins. [facility] was recommended but I do not see that this was ever completed, and patient does not recall any further follow-up. - Notified [facility] staff to check on options for this referral, check with [facility] and if not able see them due to ins problem, can consider other options [facility] and [facility]

## 2017-03-13 NOTE — Assessment & Plan Note (Signed)
Previously seemed to be improved on Duloxetine, however now seems gradual worsening with nightmares interrupted sleep. Suspect related to his known PTSD/anxiety/depression, worse scores recently - Prior meds failed Trazodone  Plan: 1. Discussion on further med management - risks with Ambien in setting of his history alcohol abuse, based on history does not sound like hypnotic or BDZ is appropriate therapy - he self tried Seroquel at home from parent, with good results. Agree to trial on 2nd gen anti-psychotic Seroquel for PTSD/insomnia, will start 50mg  tabs (take half to whole) nightly at bedtime, gradually increase dose as needed up by 25-50 q 1-2 weeks instructed, reviewed risks/benefits of this med, expressed that given severity of his symptoms and multiple layers to mental health problems for him, I recommend referral to Psychiatrist now to continue this or adjust therapy, also will need counseling - Referral to [facility]) to initiate care, since limited adult Psych at [facility] locally, also handout given with self referral options possibly for therapy locally if needed

## 2017-03-17 ENCOUNTER — Telehealth (HOSPITAL_COMMUNITY): Payer: Self-pay | Admitting: Family Medicine

## 2017-03-18 ENCOUNTER — Telehealth: Payer: Self-pay | Admitting: Family Medicine

## 2017-03-18 NOTE — Telephone Encounter (Signed)
Called Dr. Gerhard Perches office and referral detail was given they will call pt and schedule an appt with him and also called [facility] and they will call patient to schedule an appointment. Called pt about status of referral and his mom is aware.

## 2017-03-19 DIAGNOSIS — L918 Other hypertrophic disorders of the skin: Secondary | ICD-10-CM | POA: Diagnosis not present

## 2017-03-19 DIAGNOSIS — D492 Neoplasm of unspecified behavior of bone, soft tissue, and skin: Secondary | ICD-10-CM | POA: Diagnosis not present

## 2017-03-19 DIAGNOSIS — Z1283 Encounter for screening for malignant neoplasm of skin: Secondary | ICD-10-CM | POA: Diagnosis not present

## 2017-03-19 DIAGNOSIS — D485 Neoplasm of uncertain behavior of skin: Secondary | ICD-10-CM | POA: Diagnosis not present

## 2017-04-02 ENCOUNTER — Ambulatory Visit
Admission: RE | Admit: 2017-04-02 | Discharge: 2017-04-02 | Disposition: A | Discharge status: Home / Self Care | Payer: Medicare Other | Source: Ambulatory Visit | Attending: Urology | Admitting: Urology

## 2017-04-02 DIAGNOSIS — N2889 Other specified disorders of kidney and ureter: Secondary | ICD-10-CM | POA: Diagnosis not present

## 2017-04-02 DIAGNOSIS — K760 Fatty (change of) liver, not elsewhere classified: Secondary | ICD-10-CM | POA: Diagnosis not present

## 2017-04-02 DIAGNOSIS — N281 Cyst of kidney, acquired: Secondary | ICD-10-CM | POA: Diagnosis not present

## 2017-04-02 MED ORDER — GADOBENATE DIMEGLUMINE 529 MG/ML IV SOLN
20.0000 mL | Freq: Once | INTRAVENOUS | Status: AC | PRN
  Administered 2017-04-02: 20 mL via INTRAVENOUS

## 2017-04-04 LAB — POCT I-STAT CREATININE: Creatinine, Ser: 0.8 mg/dL (ref 0.61–1.24)

## 2017-04-07 DIAGNOSIS — D171 Benign lipomatous neoplasm of skin and subcutaneous tissue of trunk: Secondary | ICD-10-CM | POA: Diagnosis not present

## 2017-04-07 DIAGNOSIS — L853 Xerosis cutis: Secondary | ICD-10-CM | POA: Diagnosis not present

## 2017-04-07 DIAGNOSIS — D485 Neoplasm of uncertain behavior of skin: Secondary | ICD-10-CM | POA: Diagnosis not present

## 2017-04-07 DIAGNOSIS — L905 Scar conditions and fibrosis of skin: Secondary | ICD-10-CM | POA: Diagnosis not present

## 2017-04-07 DIAGNOSIS — L918 Other hypertrophic disorders of the skin: Secondary | ICD-10-CM | POA: Diagnosis not present

## 2017-04-09 ENCOUNTER — Ambulatory Visit: Payer: Medicare Other | Admitting: Urology

## 2017-04-25 ENCOUNTER — Ambulatory Visit (INDEPENDENT_AMBULATORY_CARE_PROVIDER_SITE_OTHER): Payer: Medicare Other | Admitting: Urology

## 2017-04-25 ENCOUNTER — Encounter: Payer: Self-pay | Admitting: Urology

## 2017-04-25 VITALS — BP 147/98 | HR 77 | Ht 67.0 in | Wt 255.9 lb

## 2017-04-25 DIAGNOSIS — N281 Cyst of kidney, acquired: Secondary | ICD-10-CM
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
## 2017-05-18 IMAGING — CT CT ABD-PELV W/ CM
2 of 5 series · 16 of 46 positions shown, 18 images · IV contrast (APPLIED)
Comparison: 06/24/2013

CLINICAL DATA: Left lower quadrant abdominal pain. History of
hernias. Symptoms are worsening.

EXAM:
CT ABDOMEN AND PELVIS WITH CONTRAST
TECHNIQUE: Multidetector CT imaging of the abdomen and pelvis was performed
using the standard protocol following bolus administration of
intravenous contrast.
CONTRAST:  100mL TZ5AED-PNN IOPAMIDOL (TZ5AED-PNN) INJECTION 61%

[Series 2: axial st · axial · 0.92mm/px · z∈[-980,-530]mm · 13 of 102 slices shown, 15 images]
[im 6/102  soft-tissue]
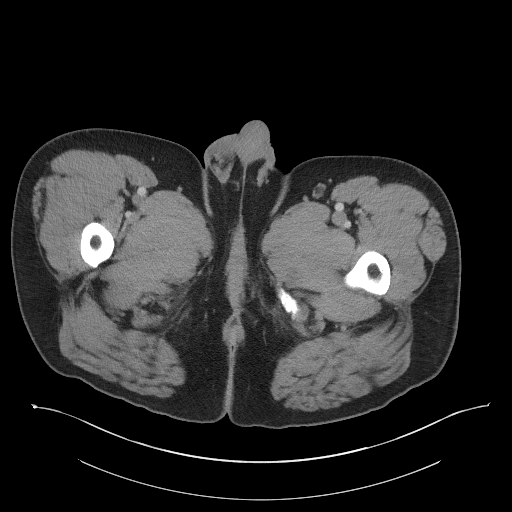
[im 6/102  bone]
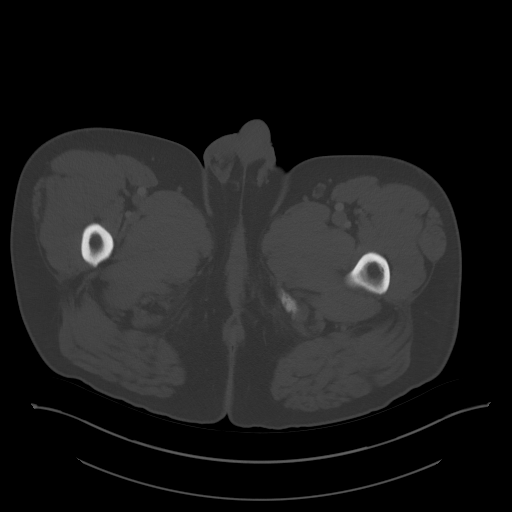
[im 16/102  soft-tissue]
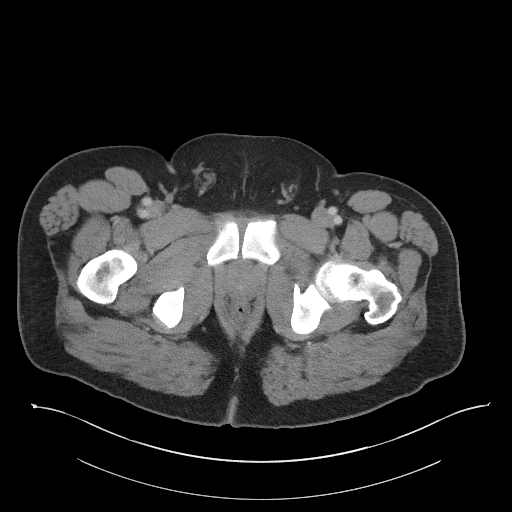
[im 22/102  soft-tissue]
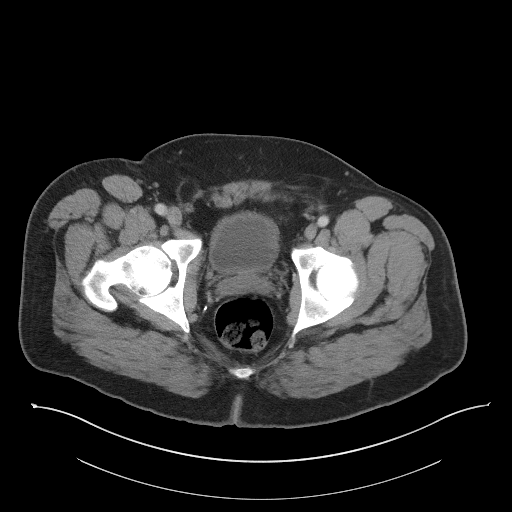
[im 27/102  soft-tissue]
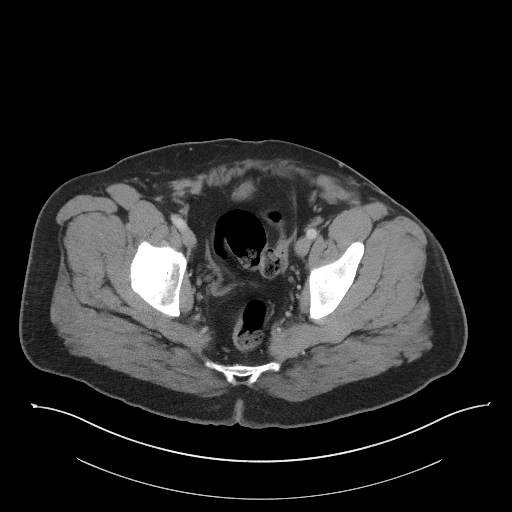
[im 38/102  soft-tissue]
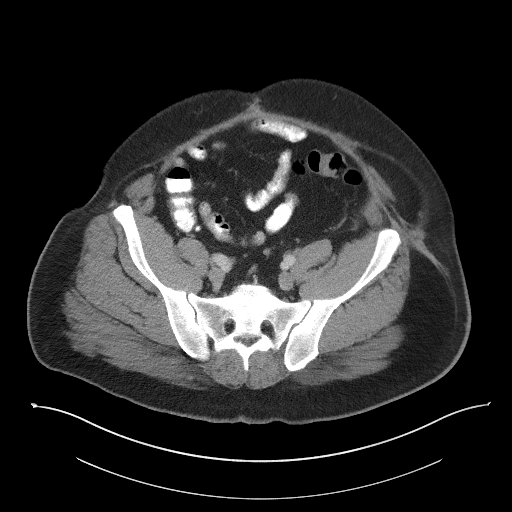
[im 43/102  soft-tissue]
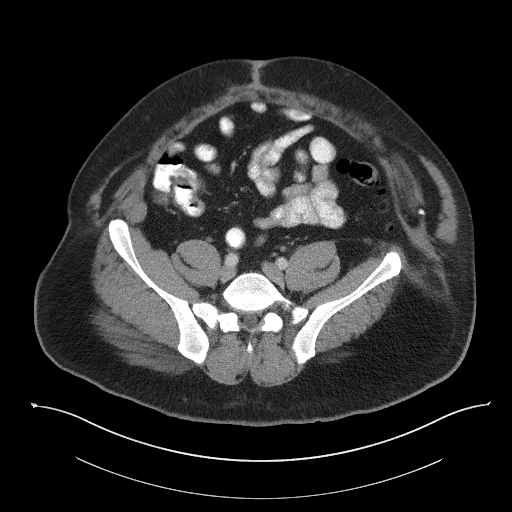
[im 54/102  soft-tissue]
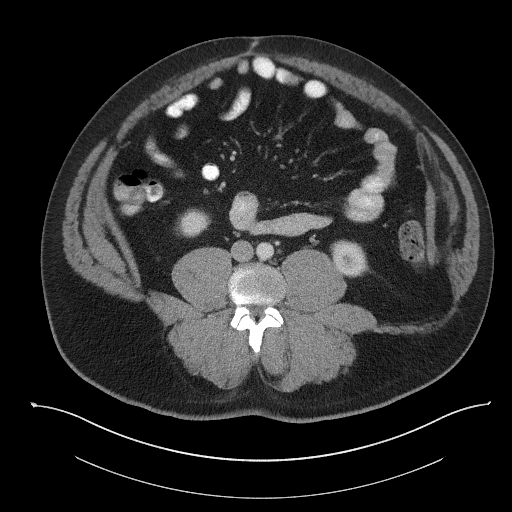
[im 59/102  soft-tissue]
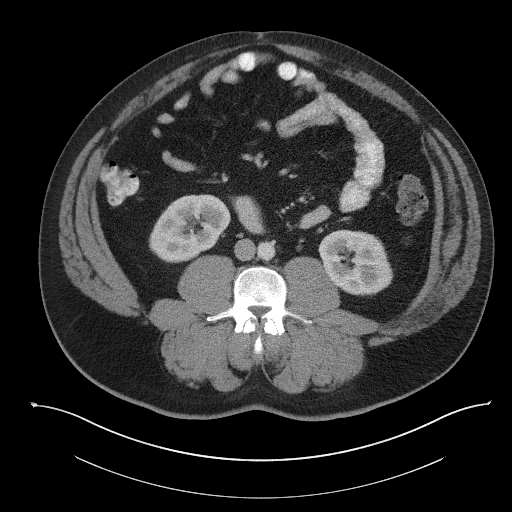
[im 64/102  soft-tissue]
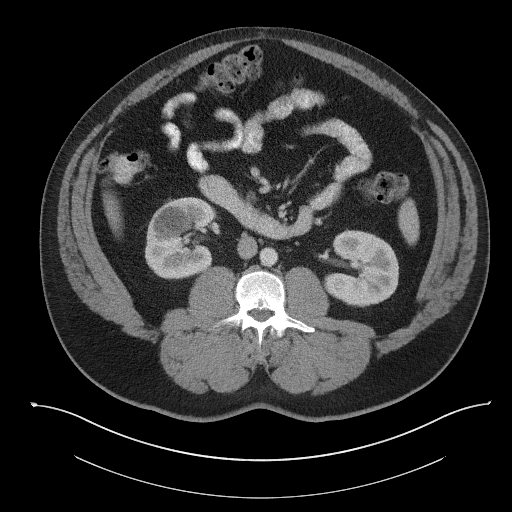
[im 64/102  bone]
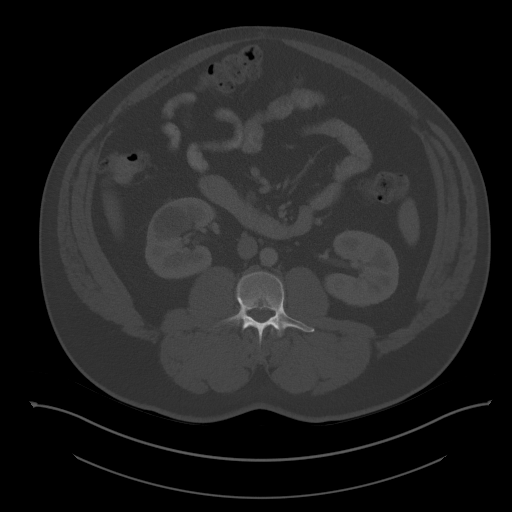
[im 75/102  soft-tissue]
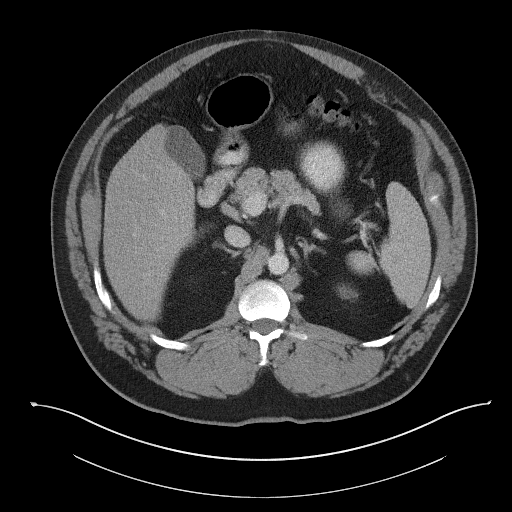
[im 80/102  soft-tissue]
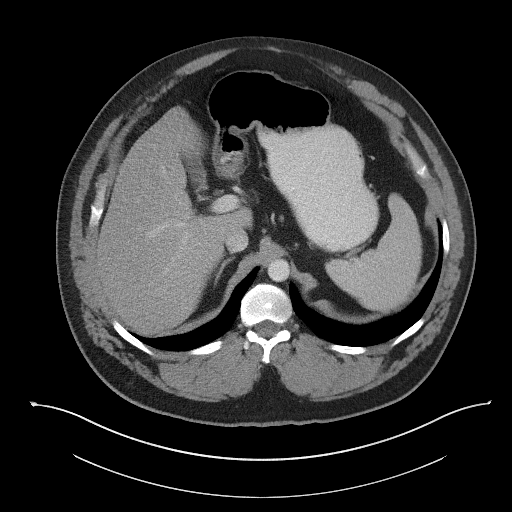
[im 86/102  soft-tissue]
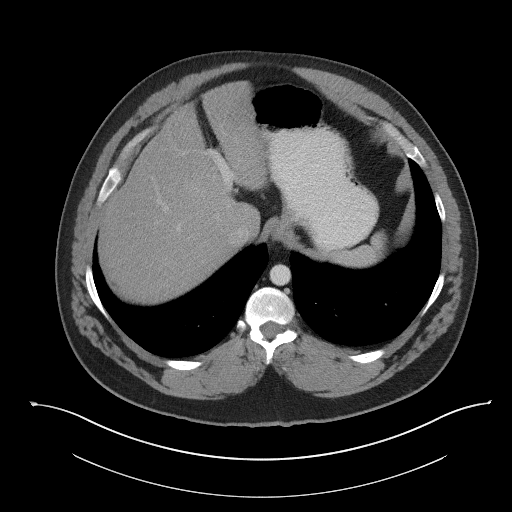
[im 96/102  soft-tissue]
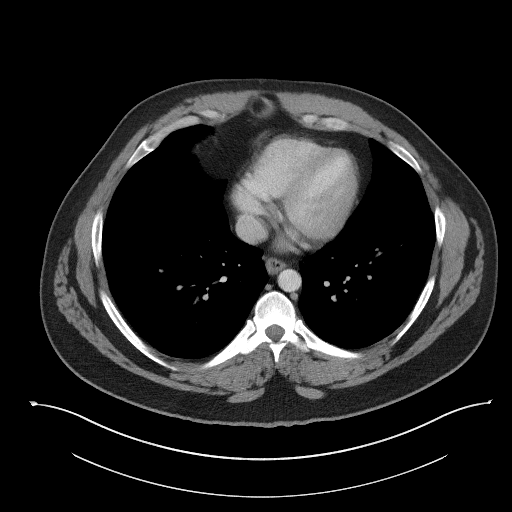

[Series 5: coronal st · coronal · 0.77mm/px · 3 of 123 slices shown]
[im 41/123  soft-tissue]
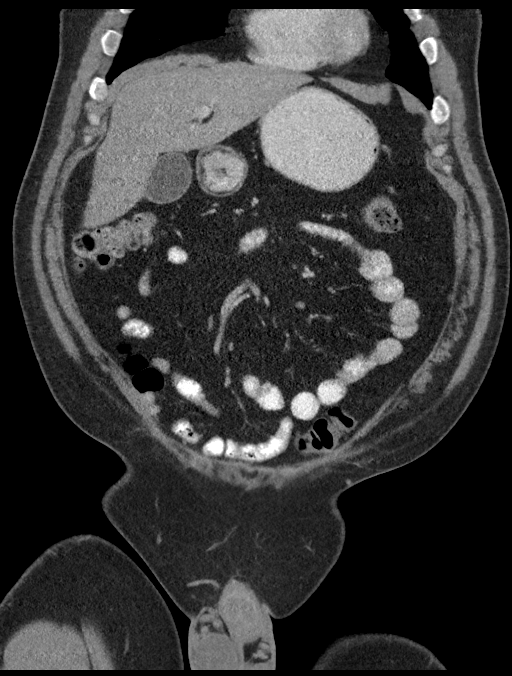
[im 55/123  soft-tissue]
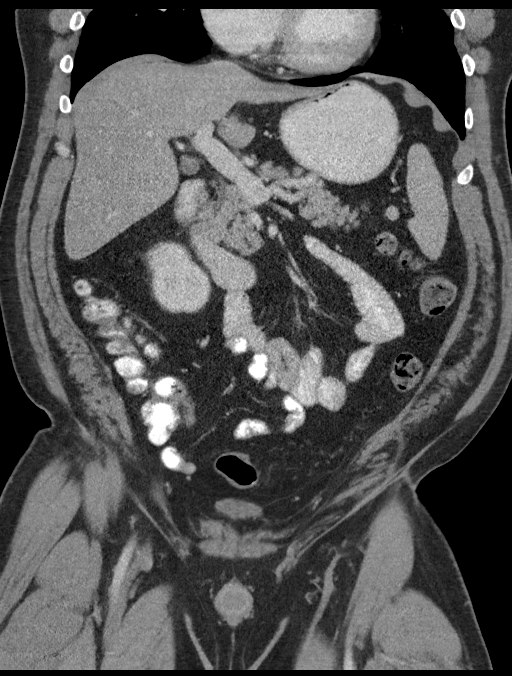
[im 68/123  soft-tissue]
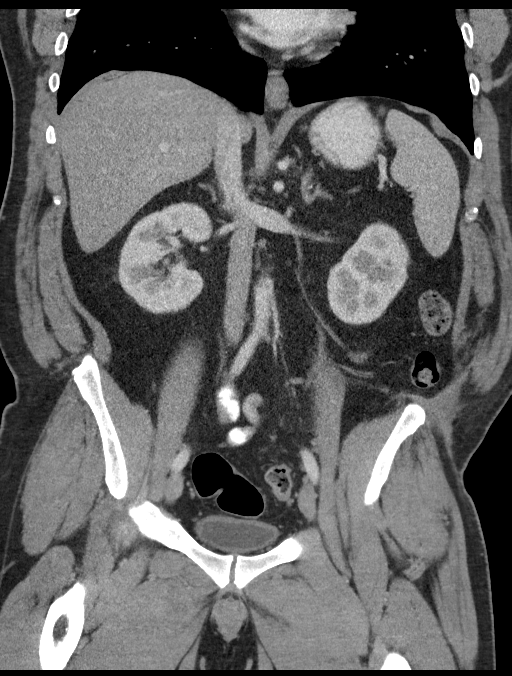

[16 of 46 positions shown; findings below may reference images not displayed]

FINDINGS: Lower chest: Lung bases are clear.  No pleural or pericardial fluid.

Hepatobiliary: No liver parenchymal abnormality. No calcified
gallstones.

Pancreas: Normal

Spleen: Normal

Adrenals/Urinary Tract: Adrenal glands are normal. Cystic
abnormality in the right kidney is again demonstrated measuring 3 cm
in diameter. Not significantly changed since 2905. Sub cm cyst at
the lower pole the right kidney. Left kidney is normal.

Stomach/Bowel: No evidence of ileus, obstruction or free air. No
other acute bowel finding.

Vascular/Lymphatic: Normal

Reproductive: Normal

Other: Again demonstrated is an inferior lumbar hernia on the left
containing fat and a portion of the descending colon. No evidence of
bowel incarceration or inflammatory change. This appears the same as
was seen 3 years ago. No new or hernia.

Musculoskeletal: Negative
IMPRESSION: No change in a left inferior lumbar hernia containing fat and a
short segment of the descending colon. No imaging finding to suggest
that this would be symptomatic presently.

Chronic 3 cm cystic abnormality of the right kidney, slightly larger
than on the previous study when it measured only 2.6 cm. No
pronounced change however. By MRI, this was felt to be a Bosniak 3
lesion.

## 2017-05-19 DIAGNOSIS — D229 Melanocytic nevi, unspecified: Secondary | ICD-10-CM | POA: Diagnosis not present

## 2017-05-19 DIAGNOSIS — D485 Neoplasm of uncertain behavior of skin: Secondary | ICD-10-CM | POA: Diagnosis not present

## 2017-05-21 ENCOUNTER — Ambulatory Visit (HOSPITAL_COMMUNITY): Payer: Medicare Other | Admitting: Psychiatry

## 2017-05-21 ENCOUNTER — Ambulatory Visit (INDEPENDENT_AMBULATORY_CARE_PROVIDER_SITE_OTHER): Payer: Medicare Other | Admitting: Psychiatry

## 2017-05-21 ENCOUNTER — Encounter (HOSPITAL_COMMUNITY): Payer: Self-pay | Admitting: Psychiatry

## 2017-05-21 VITALS — BP 132/85 | HR 80 | Ht 66.0 in | Wt 259.6 lb

## 2017-05-21 DIAGNOSIS — G4701 Insomnia due to medical condition: Secondary | ICD-10-CM

## 2017-05-21 DIAGNOSIS — R109 Unspecified abdominal pain: Secondary | ICD-10-CM | POA: Diagnosis not present

## 2017-05-21 DIAGNOSIS — S069XAA Unspecified intracranial injury with loss of consciousness status unknown, initial encounter: Secondary | ICD-10-CM | POA: Insufficient documentation

## 2017-05-21 DIAGNOSIS — F418 Other specified anxiety disorders: Secondary | ICD-10-CM | POA: Diagnosis not present

## 2017-05-21 DIAGNOSIS — F639 Impulse disorder, unspecified: Secondary | ICD-10-CM | POA: Insufficient documentation

## 2017-05-21 DIAGNOSIS — R4582 Worries: Secondary | ICD-10-CM | POA: Diagnosis not present

## 2017-05-21 DIAGNOSIS — F419 Anxiety disorder, unspecified: Secondary | ICD-10-CM | POA: Diagnosis not present

## 2017-05-21 DIAGNOSIS — S069X5S Unspecified intracranial injury with loss of consciousness greater than 24 hours with return to pre-existing conscious level, sequela: Secondary | ICD-10-CM

## 2017-05-21 DIAGNOSIS — M549 Dorsalgia, unspecified: Secondary | ICD-10-CM

## 2017-05-21 DIAGNOSIS — R450 Nervousness: Secondary | ICD-10-CM

## 2017-05-21 DIAGNOSIS — F1210 Cannabis abuse, uncomplicated: Secondary | ICD-10-CM

## 2017-05-21 DIAGNOSIS — R4587 Impulsiveness: Secondary | ICD-10-CM

## 2017-05-21 DIAGNOSIS — F4310 Post-traumatic stress disorder, unspecified: Secondary | ICD-10-CM | POA: Diagnosis not present

## 2017-05-21 DIAGNOSIS — Z87820 Personal history of traumatic brain injury: Secondary | ICD-10-CM | POA: Insufficient documentation

## 2017-05-21 DIAGNOSIS — S069X9A Unspecified intracranial injury with loss of consciousness of unspecified duration, initial encounter: Secondary | ICD-10-CM | POA: Insufficient documentation

## 2017-05-21 MED ORDER — PRAZOSIN HCL 2 MG PO CAPS: 2.0000 mg | ORAL_CAPSULE | Freq: Every day | ORAL | 2 refills | Status: DC

## 2017-05-21 MED ORDER — DULOXETINE HCL 30 MG PO CPEP: ORAL_CAPSULE | ORAL | 5 refills | Status: DC

## 2017-05-21 MED ORDER — DULOXETINE HCL 60 MG PO CPEP: ORAL_CAPSULE | ORAL | 5 refills | Status: DC
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
## 2017-06-24 ENCOUNTER — Ambulatory Visit (HOSPITAL_COMMUNITY): Payer: Self-pay | Admitting: Licensed Clinical Social Worker

## 2017-08-21 ENCOUNTER — Ambulatory Visit (HOSPITAL_COMMUNITY): Payer: Self-pay | Admitting: Psychiatry

## 2017-09-22 DIAGNOSIS — L578 Other skin changes due to chronic exposure to nonionizing radiation: Secondary | ICD-10-CM | POA: Diagnosis not present

## 2017-09-22 DIAGNOSIS — L821 Other seborrheic keratosis: Secondary | ICD-10-CM | POA: Diagnosis not present

## 2017-09-22 DIAGNOSIS — L918 Other hypertrophic disorders of the skin: Secondary | ICD-10-CM | POA: Diagnosis not present

## 2017-09-22 DIAGNOSIS — D225 Melanocytic nevi of trunk: Secondary | ICD-10-CM | POA: Diagnosis not present

## 2017-09-22 DIAGNOSIS — D492 Neoplasm of unspecified behavior of bone, soft tissue, and skin: Secondary | ICD-10-CM | POA: Diagnosis not present

## 2017-09-22 DIAGNOSIS — L853 Xerosis cutis: Secondary | ICD-10-CM | POA: Diagnosis not present

## 2018-04-24 ENCOUNTER — Ambulatory Visit: Payer: Medicare Other | Admitting: Urology

## 2018-04-28 ENCOUNTER — Ambulatory Visit: Payer: Self-pay | Admitting: Urology

## 2018-05-12 ENCOUNTER — Ambulatory Visit: Admission: RE | Admit: 2018-05-12 | Payer: Medicare Other | Source: Ambulatory Visit

## 2018-05-26 ENCOUNTER — Ambulatory Visit: Payer: Medicare Other | Admitting: Urology

## 2018-05-27 ENCOUNTER — Encounter: Payer: Self-pay | Admitting: Urology

## 2018-09-23 DIAGNOSIS — L578 Other skin changes due to chronic exposure to nonionizing radiation: Secondary | ICD-10-CM | POA: Diagnosis not present

## 2018-09-23 DIAGNOSIS — L918 Other hypertrophic disorders of the skin: Secondary | ICD-10-CM | POA: Diagnosis not present

## 2018-09-23 DIAGNOSIS — L720 Epidermal cyst: Secondary | ICD-10-CM | POA: Diagnosis not present

## 2018-09-23 DIAGNOSIS — Z86018 Personal history of other benign neoplasm: Secondary | ICD-10-CM | POA: Diagnosis not present

## 2019-03-23 DIAGNOSIS — L02811 Cutaneous abscess of head [any part, except face]: Secondary | ICD-10-CM | POA: Diagnosis not present

## 2019-11-23 DIAGNOSIS — L578 Other skin changes due to chronic exposure to nonionizing radiation: Secondary | ICD-10-CM | POA: Diagnosis not present

## 2019-11-23 DIAGNOSIS — L918 Other hypertrophic disorders of the skin: Secondary | ICD-10-CM | POA: Diagnosis not present

## 2019-11-23 DIAGNOSIS — Z86018 Personal history of other benign neoplasm: Secondary | ICD-10-CM | POA: Diagnosis not present

## 2019-11-23 DIAGNOSIS — L720 Epidermal cyst: Secondary | ICD-10-CM | POA: Diagnosis not present

## 2019-11-23 DIAGNOSIS — Z808 Family history of malignant neoplasm of other organs or systems: Secondary | ICD-10-CM | POA: Diagnosis not present

## 2019-12-28 ENCOUNTER — Ambulatory Visit (INDEPENDENT_AMBULATORY_CARE_PROVIDER_SITE_OTHER): Payer: Medicare Other | Admitting: Family Medicine

## 2019-12-28 ENCOUNTER — Other Ambulatory Visit: Payer: Self-pay

## 2019-12-28 ENCOUNTER — Encounter: Payer: Self-pay | Admitting: Family Medicine

## 2019-12-28 VITALS — BP 141/82 | HR 76 | Temp 98.0°F | Resp 16 | Ht 66.0 in | Wt 253.6 lb

## 2019-12-28 DIAGNOSIS — M79642 Pain in left hand: Secondary | ICD-10-CM | POA: Diagnosis not present

## 2019-12-28 NOTE — Progress Notes (Signed)
Subjective:    Patient ID: Cody Harrison, male    DOB: [birth year], 44 y.o.   MRN: [id number]  Cody Harrison is a 44 y.o. male presenting on 12/28/2019 for Hand Pain (left hand onset 2 days)   HPI   Left Hand Pain / Palmar aspect thumb Reports 2 nights ago on Sunday, had LEFT hand palmar thumb area acute onset sharp stabbing pain severe while he was reaching to his back and scratching with his Left hand outstretched and using dorsal hand to scratch. No prior episodes of this pain before. - Now he reports at rest he has 0 pain and is comfortable. He can oppose L thumb to his palm without any pain or problem, also can move thumb to finger all 4 digits except 5th pinky finger has mild pain. Otherwise if he grips into a fist with palmar flexion it provokes pain in base of his thumb. He can lift light objects without  - Tried Ibuprofen 800mg  last night and this AM without any relief - He has history on other meds for chronic abdominal pain including gabapentin duloxetine baclofen, he is off all meds not helping. He is not interested in similar meds for his hand. He says family member has some muscle relaxant he can try very short term to see if it works. - He has not been on prednisone but is not interested Denies any traumatic injury to thumb or hand, no fall or other complication, numbness tingling  Depression screen [facility] 2/9 03/12/2017 08/14/2016 06/25/2016  Decreased Interest 2 1 1   Down, Depressed, Hopeless 3 1 2   PHQ - 2 Score 5 2 3   Altered sleeping 3 2 2   Tired, decreased energy 3 2 2   Change in appetite 2 1 2   Feeling bad or failure about yourself  1 1 1   Trouble concentrating 2 1 1   Moving slowly or fidgety/restless 1 1 1   Suicidal thoughts 0 0 0  PHQ-9 Score 17 10 12   Difficult doing work/chores Somewhat difficult Not difficult at all Not difficult at all    Social History   Tobacco Use   Smoking status: Never Smoker   Smokeless tobacco: Current User    Types: Chew    Tobacco comment: 30 years, chewing tobacco  Substance Use Topics   Alcohol use: No    Comment: Last Drink- 02/15/16 (Prior Heavy Use)   Drug use: Yes    Types: Marijuana    Comment: Regular use for pain control and sleep. Last Use- 06/06/16    Review of Systems Per HPI unless specifically indicated above     Objective:    BP (!) 141/82    Pulse 76    Temp 98 F (36.7 C) (Temporal)    Resp 16    Ht 5\' 6"  (1.676 m)    Wt 253 lb 9.6 oz (115 kg)    BMI 40.93 kg/m   Wt Readings from Last 3 Encounters:  12/28/19 253 lb 9.6 oz (115 kg)  04/25/17 255 lb 14.4 oz (116.1 kg)  03/12/17 252 lb 9.6 oz (114.6 kg)    Physical Exam Vitals and nursing note reviewed.  Constitutional:      General: He is not in acute distress.    Appearance: He is well-developed. He is obese. He is not diaphoretic.     Comments: Well-appearing, comfortable, cooperative  HENT:     Head: Normocephalic and atraumatic.  Eyes:     General:  Right eye: No discharge.        Left eye: No discharge.     Conjunctiva/sclera: Conjunctivae normal.  Cardiovascular:     Rate and Rhythm: Normal rate.  Pulmonary:     Effort: Pulmonary effort is normal.  Musculoskeletal:     Comments: Left hand Mild localized soft tissue edema palmar CMC joint and dorsal near thumb inter-digit space, non tender over scaphoid, no bony tenderness CMC or other MCP joints, full range of motion Left thumb in opposition and flexion, finger to finger testing is normal some mild provoked pain with reaching to 5th finger only, no pain on thumb movement except has provoked moderate to severe same pain with finger flexion into closed fist. No deformity palpable or triggering of finger. No obvious ecchymosis or erythema.  Skin:    General: Skin is warm and dry.     Findings: No erythema or rash.  Neurological:     Mental Status: He is alert and oriented to person, place, and time.  Psychiatric:        Behavior: Behavior normal.     Comments:  Well groomed, good eye contact, normal speech and thoughts       Results for orders placed or performed during the hospital encounter of 04/02/17  I-STAT creatinine  Result Value Ref Range   Creatinine, Ser 0.80 0.61 - 1.24 mg/dL      Assessment & Plan:   Problem List Items Addressed This Visit    None    Visit Diagnoses    Left hand pain    -  Primary      Localized L hand palmar CMC-mid hand pain only with provoked finger flexion into a grip, worse with heavier lifting or objects. Seems secondary to hyperextension injury based on acute onset Some localized swelling No evidence of bony tenderness. Has ROM so does not seem to be acute tendon rupture.  Reassurance, uncertain exact cause Can try OTC medicines with NSAIDs, may use existing muscle relaxant PRN notify if need re order. Defer prednisone burst May use splint or thumb spica splint PRN off load He can continue to work, he does not do heavy lifting and he can manage now so no work note at this time If not improving as expected 1-2 weeks, if it was a tendon strain or hyperextension injury - then we can refer to [facility] [place] to [employer] Dr Peggye Ley  No orders of the defined types were placed in this encounter.     Follow up plan: Return in about 2 weeks (around 01/11/2020), or if symptoms worsen or fail to improve, for left hand/grip pain.  Nobie Putnam, DO [facility] 12/28/2019, 4:13 PM

## 2019-12-28 NOTE — Patient Instructions (Addendum)
Thank you for coming to the office today.  May try the muscle relaxant to see if relief. Use with caution for sedation.  Try topical muscle rub as well - Voltaren.  If not improving within 1-2 weeks, we can place referral - (just let me know call or message) or you can do a walk in evaluation  They have a hand / wrist specialist Dr Gwyneth Revels (formerly [facility]) Address: [address], [place], [place] [address] Hours:  9AM-5PM Phone: [phone]  Please schedule a Follow-up Appointment to: Return in about 2 weeks (around 01/11/2020), or if symptoms worsen or fail to improve, for left hand/grip pain.  If you have any other questions or concerns, please feel free to call the office or send a message through [place]. You may also schedule an earlier appointment if necessary.  Additionally, you may be receiving a survey about your experience at our office within a few days to 1 week by e-mail or mail. We value your feedback.  Nobie Putnam, DO [place]

## 2020-01-11 ENCOUNTER — Telehealth: Payer: Self-pay

## 2020-01-11 DIAGNOSIS — M79642 Pain in left hand: Secondary | ICD-10-CM

## 2020-01-11 DIAGNOSIS — S6982XA Other specified injuries of left wrist, hand and finger(s), initial encounter: Secondary | ICD-10-CM

## 2020-01-11 NOTE — Telephone Encounter (Signed)
Please notify patient that referral has been placed.  To [facility] - Dr Leretha Pol for hand pain. They will call him with apt.   Reason for Referral: 3-4 weeks of left hand palmar/thumb area hyperextension injury with hand pain, with grip pain and lifting, lack of improvement on conservative therapy, no traumatic injury, limiting his function at work with lifting. Requesting consultation from Hand/Wrist orthopedics   Has the referral been discussed with the patient?: Yes   Designated contact for the referral if not the patient (name/phone number): patient [phone] (M)   Has the patient seen a specialist for this issue before?: No  If so, who (practice/provider)?   Does the patient have a provider or location preference for the referral?: Dr Leretha Pol - [facility] [place]  Would the patient like to see previous specialist if applicable?  Nobie Putnam, [facility] 01/11/2020, 5:08 PM

## 2020-01-11 NOTE — Telephone Encounter (Signed)
Copied from [place] [id number]. Topic: Referral - Request for Referral >> Jan 10, 2020 10:51 AM Greggory Keen D wrote: Has patient seen PCP for this complaint? Yes.   Preferred provider/office: [facility] Reason for referral: hand pain

## 2020-01-12 NOTE — Telephone Encounter (Signed)
Unable to reach the patient to notify about the referral so My chart message send.

## 2020-01-21 ENCOUNTER — Other Ambulatory Visit: Payer: Self-pay | Admitting: Family Medicine

## 2020-01-21 ENCOUNTER — Encounter: Payer: Self-pay | Admitting: Family Medicine

## 2020-01-21 ENCOUNTER — Other Ambulatory Visit: Payer: Self-pay

## 2020-01-21 ENCOUNTER — Ambulatory Visit (INDEPENDENT_AMBULATORY_CARE_PROVIDER_SITE_OTHER): Payer: Medicare Other | Admitting: Family Medicine

## 2020-01-21 VITALS — BP 133/71 | HR 86 | Resp 16 | Ht 66.0 in | Wt 250.6 lb

## 2020-01-21 DIAGNOSIS — R7303 Prediabetes: Secondary | ICD-10-CM

## 2020-01-21 DIAGNOSIS — Z01812 Encounter for preprocedural laboratory examination: Secondary | ICD-10-CM

## 2020-01-21 DIAGNOSIS — R030 Elevated blood-pressure reading, without diagnosis of hypertension: Secondary | ICD-10-CM

## 2020-01-21 DIAGNOSIS — Z0189 Encounter for other specified special examinations: Secondary | ICD-10-CM

## 2020-01-21 DIAGNOSIS — D582 Other hemoglobinopathies: Secondary | ICD-10-CM

## 2020-01-21 NOTE — Progress Notes (Signed)
Patient overdue for lab results. No recent serum creatinine or CBC.  He is here only for labs prior to upcoming MRI per [facility].  He tried to schedule lab only visit but was schedule for office visit instead.  We do not have phlebotomy in afternoon. He elects to return next week Tuesday morning. 01/25/20 at 930am  No medical advice or treatment given today.  Nobie Putnam, DO [facility] 01/21/2020, 2:54 PM

## 2020-01-25 ENCOUNTER — Other Ambulatory Visit: Payer: Medicare Other

## 2020-01-25 ENCOUNTER — Other Ambulatory Visit: Payer: Self-pay

## 2020-01-25 DIAGNOSIS — R7303 Prediabetes: Secondary | ICD-10-CM

## 2020-01-25 DIAGNOSIS — Z01812 Encounter for preprocedural laboratory examination: Secondary | ICD-10-CM | POA: Diagnosis not present

## 2020-01-25 DIAGNOSIS — D582 Other hemoglobinopathies: Secondary | ICD-10-CM | POA: Diagnosis not present

## 2020-01-25 DIAGNOSIS — R030 Elevated blood-pressure reading, without diagnosis of hypertension: Secondary | ICD-10-CM

## 2020-01-26 LAB — COMPLETE METABOLIC PANEL WITH GFR
AG Ratio: 1.3 (calc) (ref 1.0–2.5)
ALT: 40 U/L (ref 9–46)
AST: 38 U/L (ref 10–40)
Albumin: 3.8 g/dL (ref 3.6–5.1)
Alkaline phosphatase (APISO): 73 U/L (ref 36–130)
BUN: 11 mg/dL (ref 7–25)
CO2: 29 mmol/L (ref 20–32)
Calcium: 9.2 mg/dL (ref 8.6–10.3)
Chloride: 101 mmol/L (ref 98–110)
Creat: 0.83 mg/dL (ref 0.60–1.35)
GFR, Est African American: 122 mL/min/{1.73_m2} (ref >=60)
GFR, Est Non African American: 106 mL/min/{1.73_m2} (ref >=60)
Globulin: 2.9 g/dL (calc) (ref 1.9–3.7)
Glucose, Bld: 141 mg/dL — ABNORMAL HIGH (ref 65–99)
Potassium: 4.4 mmol/L (ref 3.5–5.3)
Sodium: 137 mmol/L (ref 135–146)
Total Bilirubin: 0.7 mg/dL (ref 0.2–1.2)
Total Protein: 6.7 g/dL (ref 6.1–8.1)

## 2020-01-26 LAB — CBC WITH DIFFERENTIAL/PLATELET
Absolute Monocytes: 512 cells/uL (ref 200–950)
Basophils Absolute: 73 cells/uL (ref 0–200)
Basophils Relative: 1.2 %
Eosinophils Absolute: 329 cells/uL (ref 15–500)
Eosinophils Relative: 5.4 %
HCT: 48.5 % (ref 38.5–50.0)
Hemoglobin: 16.7 g/dL (ref 13.2–17.1)
Lymphs Abs: 2477 cells/uL (ref 850–3900)
MCH: 32.8 pg (ref 27.0–33.0)
MCHC: 34.4 g/dL (ref 32.0–36.0)
MCV: 95.3 fL (ref 80.0–100.0)
MPV: 8.8 fL (ref 7.5–12.5)
Monocytes Relative: 8.4 %
Neutro Abs: 2708 cells/uL (ref 1500–7800)
Neutrophils Relative %: 44.4 %
Platelets: 178 10*3/uL (ref 140–400)
RBC: 5.09 10*6/uL (ref 4.20–5.80)
RDW: 11.9 % (ref 11.0–15.0)
Total Lymphocyte: 40.6 %
WBC: 6.1 10*3/uL (ref 3.8–10.8)

## 2020-01-26 LAB — HEMOGLOBIN A1C
Hgb A1c MFr Bld: 8.1 % of total Hgb — ABNORMAL HIGH (ref <5.7)
Mean Plasma Glucose: 186 (calc)
eAG (mmol/L): 10.3 (calc)

## 2020-11-22 DIAGNOSIS — Z808 Family history of malignant neoplasm of other organs or systems: Secondary | ICD-10-CM | POA: Diagnosis not present

## 2020-11-22 DIAGNOSIS — Z86018 Personal history of other benign neoplasm: Secondary | ICD-10-CM | POA: Diagnosis not present

## 2020-11-22 DIAGNOSIS — L578 Other skin changes due to chronic exposure to nonionizing radiation: Secondary | ICD-10-CM | POA: Diagnosis not present

## 2020-11-22 DIAGNOSIS — L720 Epidermal cyst: Secondary | ICD-10-CM | POA: Diagnosis not present

## 2021-11-22 DIAGNOSIS — L720 Epidermal cyst: Secondary | ICD-10-CM | POA: Diagnosis not present

## 2021-11-22 DIAGNOSIS — Z86018 Personal history of other benign neoplasm: Secondary | ICD-10-CM | POA: Diagnosis not present

## 2021-11-22 DIAGNOSIS — L578 Other skin changes due to chronic exposure to nonionizing radiation: Secondary | ICD-10-CM | POA: Diagnosis not present

## 2022-09-24 ENCOUNTER — Ambulatory Visit: Payer: Medicare Other | Admitting: Family Medicine

## 2022-09-25 ENCOUNTER — Ambulatory Visit (INDEPENDENT_AMBULATORY_CARE_PROVIDER_SITE_OTHER): Payer: Medicare Other | Admitting: Family Medicine

## 2022-09-25 ENCOUNTER — Encounter: Payer: Self-pay | Admitting: Family Medicine

## 2022-09-25 VITALS — BP 138/80 | HR 96 | Ht 67.0 in | Wt 234.4 lb

## 2022-09-25 DIAGNOSIS — F4310 Post-traumatic stress disorder, unspecified: Secondary | ICD-10-CM | POA: Diagnosis not present

## 2022-09-25 DIAGNOSIS — N281 Cyst of kidney, acquired: Secondary | ICD-10-CM | POA: Diagnosis not present

## 2022-09-25 DIAGNOSIS — R1031 Right lower quadrant pain: Secondary | ICD-10-CM

## 2022-09-25 DIAGNOSIS — K439 Ventral hernia without obstruction or gangrene: Secondary | ICD-10-CM | POA: Diagnosis not present

## 2022-09-25 DIAGNOSIS — E1169 Type 2 diabetes mellitus with other specified complication: Secondary | ICD-10-CM | POA: Diagnosis not present

## 2022-09-25 DIAGNOSIS — Z6841 Body Mass Index (BMI) 40.0 and over, adult: Secondary | ICD-10-CM

## 2022-09-25 DIAGNOSIS — R1032 Left lower quadrant pain: Secondary | ICD-10-CM | POA: Diagnosis not present

## 2022-09-25 DIAGNOSIS — E782 Mixed hyperlipidemia: Secondary | ICD-10-CM | POA: Diagnosis not present

## 2022-09-25 DIAGNOSIS — S069X5S Unspecified intracranial injury with loss of consciousness greater than 24 hours with return to pre-existing conscious level, sequela: Secondary | ICD-10-CM

## 2022-09-25 NOTE — Progress Notes (Incomplete)
Subjective:    Patient ID: Cody Harrison, male    DOB: 01-17-1976, 47 y.o.   MRN: [id number]  Cody Harrison is a 47 y.o. male presenting on 09/25/2022 for Groin Pain, Referral, and Depression   HPI  Chronic Bilateral Lower Abdominal Pain Baldo Ash Dr Franchot Erichsen, s/p mesh repair from [facility] open laparotomy He has history of ventral hernia evaluated 8-9 years ago w/ bariatrics and considered repair however due to his obesity they delayed this surgery instead to pursue weight management  PMH PTSD, Major Depression Has used marijuana for mood and pain  Complex Renal Cyst Previous history of abnormal  Referral back to Dr Erlene Quan renal cyst - due for repeat MRI, last seen 2018, did not do MRI 2019. He would like to return to Urology  He has been on medical disability and will now return to care        09/25/2022    3:08 PM 01/21/2020    2:23 PM 03/12/2017    4:48 PM  Depression screen PHQ 2/9  Decreased Interest 2 0 2  Down, Depressed, Hopeless 2 0 3  PHQ - 2 Score 4 0 5  Altered sleeping 3 0 3  Tired, decreased energy 2 0 3  Change in appetite 1 0 2  Feeling bad or failure about yourself  1 0 1  Trouble concentrating 2 0 2  Moving slowly or fidgety/restless 2 0 1  Suicidal thoughts 0 0 0  PHQ-9 Score 15 0 17  Difficult doing work/chores Extremely dIfficult Not difficult at all Somewhat difficult      09/25/2022    3:08 PM 03/12/2017    4:50 PM 06/25/2016    3:48 PM 05/28/2016    3:27 PM  GAD 7 : Generalized Anxiety Score  Nervous, Anxious, on Edge '3 3 1 2  '$ Control/stop worrying '2 3 1 2  '$ Worry too much - different things '2 3 1 2  '$ Trouble relaxing '3 3 2 3  '$ Restless '2 3 2 3  '$ Easily annoyed or irritable '2 3 2 2  '$ Afraid - awful might happen '1 1 1 1  '$ Total GAD 7 Score '15 19 10 15  '$ Anxiety Difficulty Somewhat difficult Somewhat difficult Not difficult at all Not difficult at all      Social History   Tobacco Use  . Smoking status: Never  . Smokeless tobacco: Current     Types: Chew  . Tobacco comments:    30 years, chewing tobacco  Vaping Use  . Vaping Use: Never used  Substance Use Topics  . Alcohol use: No    Comment: Last Drink- 02/15/16 (Prior Heavy Use)  . Drug use: Yes    Types: Marijuana    Comment: Regular use for pain control and sleep. Last Use- 06/06/16    Review of Systems Per HPI unless specifically indicated above     Objective:    BP (!) 144/80   Pulse 96   Ht '5\' 7"'$  (1.702 m)   Wt 234 lb 6.4 oz (106.3 kg)   SpO2 98%   BMI 36.71 kg/m   Wt Readings from Last 3 Encounters:  09/25/22 234 lb 6.4 oz (106.3 kg)  01/21/20 250 lb 9.6 oz (113.7 kg)  12/28/19 253 lb 9.6 oz (115 kg)    Physical Exam Vitals and nursing note reviewed.  Constitutional:      General: He is not in acute distress.    Appearance: He is well-developed. He is obese. He is not diaphoretic.  Comments: Well-appearing, comfortable, cooperative  HENT:     Head: Normocephalic and atraumatic.  Eyes:     General:        Right eye: No discharge.        Left eye: No discharge.     Conjunctiva/sclera: Conjunctivae normal.  Neck:     Thyroid: No thyromegaly.  Cardiovascular:     Rate and Rhythm: Normal rate and regular rhythm.     Pulses: Normal pulses.     Heart sounds: Normal heart sounds. No murmur heard. Pulmonary:     Effort: Pulmonary effort is normal. No respiratory distress.     Breath sounds: Normal breath sounds. No wheezing or rales.  Abdominal:     General: Bowel sounds are normal. There is no distension.     Palpations: There is no mass.     Tenderness: There is abdominal tenderness (bilateral lower abdomen pain over pannus, difficult to discern any active hernia but he has history of ventral hernia s/p repair). There is no guarding.     Hernia: No hernia is present.  Musculoskeletal:        General: Normal range of motion.     Cervical back: Normal range of motion and neck supple.  Lymphadenopathy:     Cervical: No cervical adenopathy.   Skin:    General: Skin is warm and dry.     Findings: No erythema or rash.  Neurological:     Mental Status: He is alert and oriented to person, place, and time. Mental status is at baseline.  Psychiatric:        Behavior: Behavior normal.     Comments: Well groomed, good eye contact, normal speech and thoughts    Results for orders placed or performed in visit on 01/25/20  COMPLETE METABOLIC PANEL WITH GFR  Result Value Ref Range   Glucose, Bld 141 (H) 65 - 99 mg/dL   BUN 11 7 - 25 mg/dL   Creat 0.83 0.60 - 1.35 mg/dL   GFR, Est Non African American 106 > OR = 60 mL/min/1.18m   GFR, Est African American 122 > OR = 60 mL/min/1.761m  BUN/Creatinine Ratio NOT APPLICABLE 6 - 22 (calc)   Sodium 137 135 - 146 mmol/L   Potassium 4.4 3.5 - 5.3 mmol/L   Chloride 101 98 - 110 mmol/L   CO2 29 20 - 32 mmol/L   Calcium 9.2 8.6 - 10.3 mg/dL   Total Protein 6.7 6.1 - 8.1 g/dL   Albumin 3.8 3.6 - 5.1 g/dL   Globulin 2.9 1.9 - 3.7 g/dL (calc)   AG Ratio 1.3 1.0 - 2.5 (calc)   Total Bilirubin 0.7 0.2 - 1.2 mg/dL   Alkaline phosphatase (APISO) 73 36 - 130 U/L   AST 38 10 - 40 U/L   ALT 40 9 - 46 U/L  CBC with Differential/Platelet  Result Value Ref Range   WBC 6.1 3.8 - 10.8 Thousand/uL   RBC 5.09 4.20 - 5.80 Million/uL   Hemoglobin 16.7 13.2 - 17.1 g/dL   HCT 48.5 38.5 - 50.0 %   MCV 95.3 80.0 - 100.0 fL   MCH 32.8 27.0 - 33.0 pg   MCHC 34.4 32.0 - 36.0 g/dL   RDW 11.9 11.0 - 15.0 %   Platelets 178 140 - 400 Thousand/uL   MPV 8.8 7.5 - 12.5 fL   Neutro Abs 2,708 1,500 - 7,800 cells/uL   Lymphs Abs 2,477 850 - 3,900 cells/uL   Absolute Monocytes 512 200 -  950 cells/uL   Eosinophils Absolute 329 15 - 500 cells/uL   Basophils Absolute 73 0 - 200 cells/uL   Neutrophils Relative % 44.4 %   Total Lymphocyte 40.6 %   Monocytes Relative 8.4 %   Eosinophils Relative 5.4 %   Basophils Relative 1.2 %  Hemoglobin A1c  Result Value Ref Range   Hgb A1c MFr Bld 8.1 (H) <5.7 % of total Hgb    Mean Plasma Glucose 186 (calc)   eAG (mmol/L) 10.3 (calc)      Assessment & Plan:   Problem List Items Addressed This Visit     Complex renal cyst   Morbid obesity with BMI of 45.0-49.9, adult ([place])   Type 2 diabetes mellitus with other specified complication ([place]) - Primary     #Complex Renal Cyst referral to return to Urology for surveillance of complex renal cyst. Last seen 2018, did not follow up since 2019   #PTSD TBI Depression Re-establishing care now, can pursue further management and referrals when ready  Type 2 Diabetes Past A1c 8.1, has had elevated A1c Concern about type 2 diabetes, last A1c 8.1 Not on therapy.   No orders of the defined types were placed in this encounter.     Follow up plan: Return for 1 day for 09/26/22 for fasting lab only. Follow-up 3 month DM A1c, updates.   Future lab 09/26/22 tomrorow - CMET CBC Lipid A1c TSH   Nobie Putnam, DO [facility] 09/25/2022, 3:22 PM

## 2022-09-25 NOTE — Progress Notes (Signed)
Subjective:    Patient ID: Cody Harrison, male    DOB: 1975/09/06, 47 y.o.   MRN: [id number]  Cody Harrison is a 47 y.o. male presenting on 09/25/2022 for Groin Pain, Referral, and Depression   HPI  Chronic Bilateral Lower Abdominal Pain Baldo Ash Dr Franchot Erichsen, s/p mesh repair from [facility] open laparotomy He has history of ventral hernia evaluated 8-9 years ago w/ bariatrics and considered repair however due to his obesity they delayed this surgery instead to pursue weight management  PMH PTSD, Major Depression Has used marijuana for mood and pain  Complex Renal Cyst Previous history of abnormal  Referral back to Dr Cody Harrison renal cyst - due for repeat MRI, last seen 2018, did not do MRI 2019. He would like to return to Urology  He has been on medical disability and will now return to care        09/25/2022    3:08 PM 01/21/2020    2:23 PM 03/12/2017    4:48 PM  Depression screen PHQ 2/9  Decreased Interest 2 0 2  Down, Depressed, Hopeless 2 0 3  PHQ - 2 Score 4 0 5  Altered sleeping 3 0 3  Tired, decreased energy 2 0 3  Change in appetite 1 0 2  Feeling bad or failure about yourself  1 0 1  Trouble concentrating 2 0 2  Moving slowly or fidgety/restless 2 0 1  Suicidal thoughts 0 0 0  PHQ-9 Score 15 0 17  Difficult doing work/chores Extremely dIfficult Not difficult at all Somewhat difficult      09/25/2022    3:08 PM 03/12/2017    4:50 PM 06/25/2016    3:48 PM 05/28/2016    3:27 PM  GAD 7 : Generalized Anxiety Score  Nervous, Anxious, on Edge '3 3 1 2  '$ Control/stop worrying '2 3 1 2  '$ Worry too much - different things '2 3 1 2  '$ Trouble relaxing '3 3 2 3  '$ Restless '2 3 2 3  '$ Easily annoyed or irritable '2 3 2 2  '$ Afraid - awful might happen '1 1 1 1  '$ Total GAD 7 Score '15 19 10 15  '$ Anxiety Difficulty Somewhat difficult Somewhat difficult Not difficult at all Not difficult at all      Social History   Tobacco Use   Smoking status: Never   Smokeless tobacco: Current     Types: Chew   Tobacco comments:    30 years, chewing tobacco  Vaping Use   Vaping Use: Never used  Substance Use Topics   Alcohol use: No    Comment: Last Drink- 02/15/16 (Prior Heavy Use)   Drug use: Yes    Types: Marijuana    Comment: Regular use for pain control and sleep. Last Use- 06/06/16    Review of Systems Per HPI unless specifically indicated above     Objective:    BP (!) 144/80   Pulse 96   Ht '5\' 7"'$  (1.702 m)   Wt 234 lb 6.4 oz (106.3 kg)   SpO2 98%   BMI 36.71 kg/m   Wt Readings from Last 3 Encounters:  09/25/22 234 lb 6.4 oz (106.3 kg)  01/21/20 250 lb 9.6 oz (113.7 kg)  12/28/19 253 lb 9.6 oz (115 kg)    Physical Exam Vitals and nursing note reviewed.  Constitutional:      General: He is not in acute distress.    Appearance: He is well-developed. He is obese. He is not diaphoretic.  Comments: Well-appearing, comfortable, cooperative  HENT:     Head: Normocephalic and atraumatic.  Eyes:     General:        Right eye: No discharge.        Left eye: No discharge.     Conjunctiva/sclera: Conjunctivae normal.  Neck:     Thyroid: No thyromegaly.  Cardiovascular:     Rate and Rhythm: Normal rate and regular rhythm.     Pulses: Normal pulses.     Heart sounds: Normal heart sounds. No murmur heard. Pulmonary:     Effort: Pulmonary effort is normal. No respiratory distress.     Breath sounds: Normal breath sounds. No wheezing or rales.  Abdominal:     General: Bowel sounds are normal. There is no distension.     Palpations: There is no mass.     Tenderness: There is abdominal tenderness (bilateral lower abdomen pain over pannus, difficult to discern any active hernia but he has history of ventral hernia s/p repair). There is no guarding.     Hernia: No hernia (No inguinal hernia) is present.  Musculoskeletal:        General: Normal range of motion.     Cervical back: Normal range of motion and neck supple.  Lymphadenopathy:     Cervical: No  cervical adenopathy.  Skin:    General: Skin is warm and dry.     Findings: No erythema or rash.  Neurological:     Mental Status: He is alert and oriented to person, place, and time. Mental status is at baseline.  Psychiatric:        Behavior: Behavior normal.     Comments: Well groomed, good eye contact, normal speech and thoughts     Results for orders placed or performed in visit on 01/25/20  COMPLETE METABOLIC PANEL WITH GFR  Result Value Ref Range   Glucose, Bld 141 (H) 65 - 99 mg/dL   BUN 11 7 - 25 mg/dL   Creat 0.83 0.60 - 1.35 mg/dL   GFR, Est Non African American 106 > OR = 60 mL/min/1.31m   GFR, Est African American 122 > OR = 60 mL/min/1.734m  BUN/Creatinine Ratio NOT APPLICABLE 6 - 22 (calc)   Sodium 137 135 - 146 mmol/L   Potassium 4.4 3.5 - 5.3 mmol/L   Chloride 101 98 - 110 mmol/L   CO2 29 20 - 32 mmol/L   Calcium 9.2 8.6 - 10.3 mg/dL   Total Protein 6.7 6.1 - 8.1 g/dL   Albumin 3.8 3.6 - 5.1 g/dL   Globulin 2.9 1.9 - 3.7 g/dL (calc)   AG Ratio 1.3 1.0 - 2.5 (calc)   Total Bilirubin 0.7 0.2 - 1.2 mg/dL   Alkaline phosphatase (APISO) 73 36 - 130 U/L   AST 38 10 - 40 U/L   ALT 40 9 - 46 U/L  CBC with Differential/Platelet  Result Value Ref Range   WBC 6.1 3.8 - 10.8 Thousand/uL   RBC 5.09 4.20 - 5.80 Million/uL   Hemoglobin 16.7 13.2 - 17.1 g/dL   HCT 48.5 38.5 - 50.0 %   MCV 95.3 80.0 - 100.0 fL   MCH 32.8 27.0 - 33.0 pg   MCHC 34.4 32.0 - 36.0 g/dL   RDW 11.9 11.0 - 15.0 %   Platelets 178 140 - 400 Thousand/uL   MPV 8.8 7.5 - 12.5 fL   Neutro Abs 2,708 1,500 - 7,800 cells/uL   Lymphs Abs 2,477 850 - 3,900 cells/uL  Absolute Monocytes 512 200 - 950 cells/uL   Eosinophils Absolute 329 15 - 500 cells/uL   Basophils Absolute 73 0 - 200 cells/uL   Neutrophils Relative % 44.4 %   Total Lymphocyte 40.6 %   Monocytes Relative 8.4 %   Eosinophils Relative 5.4 %   Basophils Relative 1.2 %  Hemoglobin A1c  Result Value Ref Range   Hgb A1c MFr Bld 8.1  (H) <5.7 % of total Hgb   Mean Plasma Glucose 186 (calc)   eAG (mmol/L) 10.3 (calc)      Assessment & Plan:   Problem List Items Addressed This Visit     Complex renal cyst   Morbid obesity with BMI of 45.0-49.9, adult ([place])   Type 2 diabetes mellitus with other specified complication ([place]) - Primary     #Complex Renal Cyst referral to return to Urology for surveillance of complex renal cyst. Last seen 2018, did not follow up since 2019   #PTSD TBI Depression Re-establishing care now, can pursue further management and referrals when ready  Type 2 Diabetes Past A1c 8.1, has had elevated A1c Concern about type 2 diabetes, last A1c 8.1 Not on therapy. AVS info on options for management medication Will check labs now and if still elevated will order Metformin therapy then pursue GLP1 as covered or indicated  #Abdominal Wall / Ventral hernia Chronic abdominal pain bilateral Similar problem addressed 9 yr ago, with bariatric surgery We discussed that he would need management by more specialized surgeon, will give advice on locations of Bariatrics that may manage this abdominal wall issue. Consider meds for wt loss prior to surgery as discussed as well No obvious inguinal hernia  Orders Placed This Encounter  Procedures   COMPLETE METABOLIC PANEL WITH GFR    Standing Status:   Future    Standing Expiration Date:   12/21/2022   CBC with Differential/Platelet    Standing Status:   Future    Standing Expiration Date:   12/21/2022   Lipid panel    Standing Status:   Future    Standing Expiration Date:   12/21/2022    Order Specific Question:   Has the patient fasted?    Answer:   Yes   Hemoglobin A1c    Standing Status:   Future    Standing Expiration Date:   12/21/2022   TSH    Standing Status:   Future    Standing Expiration Date:   12/21/2022   Ambulatory referral to Urology    Referral Priority:   Routine    Referral Type:   Consultation    Referral Reason:   Specialty  Services Required    Requested Specialty:   Urology    Number of Visits Requested:   1     No orders of the defined types were placed in this encounter.     Follow up plan: Return for 1 day for 09/26/22 for fasting lab only. Follow-up 3 month DM A1c, updates.   Future lab 09/26/22 tomrorow - CMET CBC Lipid A1c TSH   Nobie Putnam, DO [facility] 09/25/2022, 3:22 PM

## 2022-09-25 NOTE — Patient Instructions (Addendum)
Thank you for coming to the office today.  Referral to Dr Erlene Quan  [facility] -1st floor [place],  [place]  [address] Phone: [phone]  For renal cyst surveillance ---------------------------  If the sugar is elevated in type 2 diabetic range  We will order the Metformin    Let me know which is preferred or in network.  [facility] Address: [place] [place], [place], [place] [address] Phone: [phone] - mostly procedure based with injections   [facility] [address], [place], [place] [address] Phone: [phone] Fax: [phone] - mostly medication options   [facility] [place], [place] [address] Ph: [phone] (Multiple locations in [place] / [employer]) - medications   [facility] [address] [place], [place]  [address] Appointments: [phone] - -mostly procedure   -----------  Labs tomorrow ordered  I am very worried you are a type 2 diabetic, last A1c was 8.1 back in 2021.  -----------------  Check with insurance to see which location is covered.  Emelda Brothers, MD  [facility] [address] [place], [place] [address] Office: [phone]  Fax: [phone]  [facility], [facility] [address] [place], [place] [address] Office: [phone]  --------  [facility] [address] [place], [place] [address]  Phone [phone]  Fax [phone]  DUE for [address] (no food or drink after midnight before the lab appointment, only water or coffee without cream/sugar on the morning of)  SCHEDULE  "Lab Only" visit in the morning at the clinic for lab draw in 1 day  - Make sure Lab Only appointment is at about 1 week before your next appointment, so that results will be available  For Lab Results, once available within 2-3 days of blood draw, you can can log in to MyChart online to view your results and a brief explanation. Also, we can discuss results at next follow-up visit.   Please schedule a Follow-up Appointment to: Return for 1 day for 09/26/22 for fasting lab only. Follow-up 3 month DM A1c, updates.  If you have any other questions or concerns, please feel free to call the office or send a message through [place]. You may also schedule an earlier appointment if necessary.  Additionally, you may be receiving a survey about your experience at our office within a few days to 1 week by e-mail or mail. We value your feedback.  Nobie Putnam, DO [place]

## 2022-09-26 ENCOUNTER — Other Ambulatory Visit: Payer: Medicare Other

## 2022-09-26 ENCOUNTER — Other Ambulatory Visit: Payer: Self-pay

## 2022-09-26 DIAGNOSIS — E1169 Type 2 diabetes mellitus with other specified complication: Secondary | ICD-10-CM | POA: Diagnosis not present

## 2022-09-26 DIAGNOSIS — Z6841 Body Mass Index (BMI) 40.0 and over, adult: Secondary | ICD-10-CM | POA: Diagnosis not present

## 2022-09-26 DIAGNOSIS — E782 Mixed hyperlipidemia: Secondary | ICD-10-CM | POA: Diagnosis not present

## 2022-09-27 ENCOUNTER — Other Ambulatory Visit: Payer: Self-pay | Admitting: Family Medicine

## 2022-09-27 DIAGNOSIS — E1169 Type 2 diabetes mellitus with other specified complication: Secondary | ICD-10-CM

## 2022-09-27 LAB — HEMOGLOBIN A1C
Hgb A1c MFr Bld: 9.4 % of total Hgb — ABNORMAL HIGH (ref <5.7)
Mean Plasma Glucose: 223 mg/dL
eAG (mmol/L): 12.4 mmol/L

## 2022-09-27 LAB — CBC WITH DIFFERENTIAL/PLATELET
Absolute Monocytes: 459 cells/uL (ref 200–950)
Basophils Absolute: 71 cells/uL (ref 0–200)
Basophils Relative: 1.4 %
Eosinophils Absolute: 107 cells/uL (ref 15–500)
Eosinophils Relative: 2.1 %
HCT: 45.1 % (ref 38.5–50.0)
Hemoglobin: 15.9 g/dL (ref 13.2–17.1)
Lymphs Abs: 1423 cells/uL (ref 850–3900)
MCH: 33.3 pg — ABNORMAL HIGH (ref 27.0–33.0)
MCHC: 35.3 g/dL (ref 32.0–36.0)
MCV: 94.5 fL (ref 80.0–100.0)
MPV: 9.9 fL (ref 7.5–12.5)
Monocytes Relative: 9 %
Neutro Abs: 3040 cells/uL (ref 1500–7800)
Neutrophils Relative %: 59.6 %
Platelets: 118 10*3/uL — ABNORMAL LOW (ref 140–400)
RBC: 4.77 10*6/uL (ref 4.20–5.80)
RDW: 11.9 % (ref 11.0–15.0)
Total Lymphocyte: 27.9 %
WBC: 5.1 10*3/uL (ref 3.8–10.8)

## 2022-09-27 LAB — COMPLETE METABOLIC PANEL WITH GFR
AG Ratio: 1.1 (calc) (ref 1.0–2.5)
ALT: 34 U/L (ref 9–46)
AST: 46 U/L — ABNORMAL HIGH (ref 10–40)
Albumin: 3.9 g/dL (ref 3.6–5.1)
Alkaline phosphatase (APISO): 108 U/L (ref 36–130)
BUN/Creatinine Ratio: 14 (calc) (ref 6–22)
BUN: 8 mg/dL (ref 7–25)
CO2: 25 mmol/L (ref 20–32)
Calcium: 9.1 mg/dL (ref 8.6–10.3)
Chloride: 102 mmol/L (ref 98–110)
Creat: 0.58 mg/dL — ABNORMAL LOW (ref 0.60–1.29)
Globulin: 3.4 g/dL (calc) (ref 1.9–3.7)
Glucose, Bld: 314 mg/dL — ABNORMAL HIGH (ref 65–99)
Potassium: 4 mmol/L (ref 3.5–5.3)
Sodium: 137 mmol/L (ref 135–146)
Total Bilirubin: 0.9 mg/dL (ref 0.2–1.2)
Total Protein: 7.3 g/dL (ref 6.1–8.1)
eGFR: 120 mL/min/{1.73_m2} (ref >=60)

## 2022-09-27 LAB — LIPID PANEL
Cholesterol: 219 mg/dL — ABNORMAL HIGH (ref <200)
HDL: 58 mg/dL (ref >=40)
LDL Cholesterol (Calc): 122 mg/dL (calc) — ABNORMAL HIGH
Non-HDL Cholesterol (Calc): 161 mg/dL (calc) — ABNORMAL HIGH (ref <130)
Total CHOL/HDL Ratio: 3.8 (calc) (ref <5.0)
Triglycerides: 256 mg/dL — ABNORMAL HIGH (ref <150)

## 2022-09-27 LAB — TSH: TSH: 2.47 mIU/L (ref 0.40–4.50)

## 2022-09-27 MED ORDER — METFORMIN HCL 500 MG PO TABS: 500.0000 mg | ORAL_TABLET | Freq: Two times a day (BID) | ORAL | 1 refills | Status: DC

## 2022-10-17 ENCOUNTER — Telehealth: Payer: Self-pay | Admitting: Family Medicine

## 2022-10-17 ENCOUNTER — Ambulatory Visit: Payer: Self-pay | Admitting: *Deleted

## 2022-10-17 NOTE — Telephone Encounter (Signed)
Per agent: "Size of cyst advice   Pts mother is calling to ask how large his the cyst. Please advise"       Chief Complaint: Information regarding cyst Symptoms: NA Frequency: NA Pertinent Negatives: Patient denies NA Disposition: [] ED /[] Urgent Care (no appt availability in office) / [] Appointment(In office/virtual)/ []  [facility]/ [] Home Care/ [] Refused Recommended Disposition /[] [facility]/ [x]  Follow-up with PCP Additional Notes: Pt's mother calling, on DPR. Questioning "What exact size is the simple cyst " Also requesting if pt can be placed on "The weight loss medication?" States was discussed and medicare will pay for it. Also asking if patients anti anxiety med be bumped up."   NT did not note pt was on anti anxiety med, offered appt. States "No but he really needs something." Advised would need appt, requesting be called in "And then at next appt we can see how it's working."  States "Just there a few weeks ago." Assured mother NT would route to practice for PCPs review and final disposition. Please advise.    Reason for Disposition  [1] Caller requesting NON-URGENT health information AND [2] PCP's office is the best resource  Answer Assessment - Initial Assessment Questions 1. REASON FOR CALL or QUESTION: "What is your reason for calling today?" or "How can I best help you?" or "What question do you have that I can help answer?"     Size of cyst  Protocols used: Information Only Call - No Triage-A-AH

## 2022-10-17 NOTE — Telephone Encounter (Signed)
Please notify patient / mother  Here are my answers to the questions (Note there are 2 separate phone calls documented with these questions)  I would prefer to discuss in June, unless they feel that they cannot wait, they can schedule sooner, but we will not be able to re-check sugar A1c at that apt.  He was recently diagnosed with Type 2 Diabetes at his last visit. He was started on Metformin rx. He does not need weight loss medication. We will need to treat his blood sugar first and the medications will also double as weight loss therapy. He is already scheduled for 01/01/23 to return and re-check A1c and we can order new medication if it is needed to help with weight.  2.   Regarding the size of the Renal Cyst. They will need to discuss further with the Urologist. At last visit, he was referred back to Urology. They last saw him in 2019 it seems. It looks like the Urology office already called the patient and spoke to his mother on 09/30/22. She told t hem she would call them back and never did. They closed the referral now. But if they call back to [facility] they can schedule him.  3.  The last visit with me on 09/25/22 we discussed several topics, and they need to understand he was lost to follow-up had not been seen in over 3 years. We cannot address every topic immediately. We can discuss anxiety medication at next visit.  Thanks  Nobie Putnam, [facility] 10/17/2022, 3:29 PM

## 2022-10-17 NOTE — Telephone Encounter (Signed)
Please refer to my notes on the other phone call listed for todays date 10/17/22. These seem to be the same questions.  Here are my answers to the questions (Note there are 2 separate phone calls documented with these questions)  I would prefer to discuss in June, unless they feel that they cannot wait, they can schedule sooner, but we will not be able to re-check sugar A1c at that apt.  He was recently diagnosed with Type 2 Diabetes at his last visit. He was started on Metformin rx. He does not need weight loss medication. We will need to treat his blood sugar first and the medications will also double as weight loss therapy. He is already scheduled for 01/01/23 to return and re-check A1c and we can order new medication if it is needed to help with weight.  2.   Regarding the size of the Renal Cyst. They will need to discuss further with the Urologist. At last visit, he was referred back to Urology. They last saw him in 2019 it seems. It looks like the Urology office already called the patient and spoke to his mother on 09/30/22. She told t hem she would call them back and never did. They closed the referral now. But if they call back to [facility] Urology they can schedule him.  3.  The last visit with me on 09/25/22 we discussed several topics, and they need to understand he was lost to follow-up had not been seen in over 3 years. We cannot address every topic immediately. We can discuss anxiety medication at next visit.  Thanks  Nobie Putnam, [facility] 10/17/2022, 3:29 PM

## 2022-10-17 NOTE — Telephone Encounter (Signed)
Pts mother is calling to request a script for weight loss medication for the patient. Please advise [phone]- [id number]

## 2022-11-05 ENCOUNTER — Other Ambulatory Visit: Payer: Self-pay | Admitting: Family Medicine

## 2022-11-05 DIAGNOSIS — E1169 Type 2 diabetes mellitus with other specified complication: Secondary | ICD-10-CM

## 2022-11-05 NOTE — Telephone Encounter (Signed)
Medication Refill - Medication: metFORMIN (GLUCOPHAGE) 500 MG tablet  Pt's mother said pt is on a wellness program, so was told by pharmacy to call us for refill  Has the patient contacted their pharmacy? yes (Agent: If no, request that the patient contact the pharmacy for the refill. If patient does not wish to contact the pharmacy document the reason why and proceed with request.) (Agent: If yes, when and what did the pharmacy advise?)contact pcp  Preferred Pharmacy (with phone number or street name):  [facility] [address] [place], [place] - [address] Phone: [phone]  Fax: [phone]     Has the patient been seen for an appointment in the last year OR does the patient have an upcoming appointment? yes  Agent: Please be advised that RX refills may take up to 3 business days. We ask that you follow-up with your pharmacy.

## 2022-11-06 NOTE — Telephone Encounter (Signed)
Unable to refill per protocol, Rx request is too soon. Last refill 09/27/22 for and 1 refill.  Requested Prescriptions  Pending Prescriptions Disp Refills   metFORMIN (GLUCOPHAGE) 500 MG tablet 180 tablet 1    Sig: Take 1 tablet (500 mg total) by mouth 2 (two) times daily with a meal.     Endocrinology:  Diabetes - Biguanides Failed - 11/05/2022  5:32 PM      Failed - Cr in normal range and within 360 days    Creat  Date Value Ref Range Status  09/26/2022 0.58 (L) 0.60 - 1.29 mg/dL Final         Failed - HBA1C is between 0 and 7.9 and within 180 days    Hgb A1c MFr Bld  Date Value Ref Range Status  09/26/2022 9.4 (H) <5.7 % of total Hgb Final    Comment:    For someone without known diabetes, a hemoglobin A1c value of 6.5% or greater indicates that they may have  diabetes and this should be confirmed with a follow-up  test. . For someone with known diabetes, a value <7% indicates  that their diabetes is well controlled and a value  greater than or equal to 7% indicates suboptimal  control. A1c targets should be individualized based on  duration of diabetes, age, comorbid conditions, and  other considerations. . Currently, no consensus exists regarding use of hemoglobin A1c for diagnosis of diabetes for children. .          Failed - B12 Level in normal range and within 720 days    No results found for: "VITAMINB12"       Passed - eGFR in normal range and within 360 days    GFR, Est African American  Date Value Ref Range Status  01/25/2020 122 > OR = 60 mL/min/1.20m2 Final   GFR, Est Non African American  Date Value Ref Range Status  01/25/2020 106 > OR = 60 mL/min/1.61m2 Final   eGFR  Date Value Ref Range Status  09/26/2022 120 > OR = 60 mL/min/1.66m2 Final         Passed - Valid encounter within last 6 months    Recent Outpatient Visits           1 month ago Type 2 diabetes mellitus with other specified complication, without long-term current use of insulin  [facility])   [facility] [place], Netta Neat, DO   2 years ago Visit for blood test   [facility] Smitty Cords, DO   2 years ago Left hand pain   [facility] [place], Netta Neat, DO   5 years ago Insomnia due to medical condition   [facility] [place], Netta Neat, DO   6 years ago Anxiety associated with depression   [facility] [place], Netta Neat, DO       Future Appointments             In 1 month [place] Charon, Netta Neat, DO [place] [facility]            Passed - CBC within normal limits and completed in the last 12 months    WBC  Date Value Ref Range Status  09/26/2022 5.1 3.8 - 10.8 Thousand/uL Final   RBC  Date Value Ref Range Status  09/26/2022 4.77 4.20 - 5.80 Million/uL Final   Hemoglobin  Date  Value Ref Range Status  09/26/2022 15.9 13.2 - 17.1 g/dL Final   HCT  Date Value Ref Range Status  09/26/2022 45.1 38.5 - 50.0 % Final   MCHC  Date Value Ref Range Status  09/26/2022 35.3 32.0 - 36.0 g/dL Final   [facility]  Date Value Ref Range Status  09/26/2022 33.3 (H) 27.0 - 33.0 pg Final   MCV  Date Value Ref Range Status  09/26/2022 94.5 80.0 - 100.0 fL Final   No results found for: "PLTCOUNTKUC", "LABPLAT", "POCPLA" RDW  Date Value Ref Range Status  09/26/2022 11.9 11.0 - 15.0 % Final

## 2023-01-01 ENCOUNTER — Ambulatory Visit (INDEPENDENT_AMBULATORY_CARE_PROVIDER_SITE_OTHER): Payer: Medicare Other | Admitting: Family Medicine

## 2023-01-01 ENCOUNTER — Encounter: Payer: Self-pay | Admitting: Family Medicine

## 2023-01-01 VITALS — BP 134/86 | HR 86 | Temp 97.1°F | Wt 224.0 lb

## 2023-01-01 DIAGNOSIS — Z1211 Encounter for screening for malignant neoplasm of colon: Secondary | ICD-10-CM | POA: Diagnosis not present

## 2023-01-01 DIAGNOSIS — E1169 Type 2 diabetes mellitus with other specified complication: Secondary | ICD-10-CM

## 2023-01-01 LAB — POCT GLYCOSYLATED HEMOGLOBIN (HGB A1C): Hemoglobin A1C: 7.6 % — AB (ref 4.0–5.6)

## 2023-01-01 NOTE — Patient Instructions (Addendum)
Thank you for coming to the office today.  Recent Labs    09/26/22 0932 01/01/23 1547  HGBA1C 9.4* 7.6*   Recommend to check the blood sugar occasionally. AM empty stomach fasting sugar.  Goal < 130-150 range.  Can check sporadically if you like as well, 2+ hours after meals or bed time.  Try the Relion Glucometer at [place], and if ready for a new meter in future check with insurance first for which brand is covered.  -------------  You are due for a Yearly Diabetic Eye Exam. Fax [place] copy of the result [phone]  [facility]   Address: [address], [place] [address] Phone: [phone]  Website: [email or web address]   [facility] [address] [place], [place] [address] Phone: [phone] [email or web address]  [facility]  Address: [address] [place], [place], [place] [address] Phone: [phone]   [facility] [address] [place], [place] [address] Phone: [phone]  [facility] Address: [address] [place], [place], [place] [address]  Phone: [phone]   Please schedule a Follow-up Appointment to: Return in about 4 months (around 05/03/2023) for 4 month DM A1c .  If you have any other questions or concerns, please feel free to call the office or send a message through MyChart. You may also schedule an earlier appointment if necessary.  Additionally, you may be receiving a survey about your experience at our office within a few days to 1 week by e-mail or mail. We value your feedback.  Saralyn Pilar, DO [facility] [place]

## 2023-01-01 NOTE — Progress Notes (Signed)
Subjective:    Patient ID: Cody Harrison, male    DOB: 03-31-1976, 47 y.o.   MRN: [id number]  Cody Harrison is a 47 y.o. male presenting on 01/01/2023 for No chief complaint on file.   HPI  CHRONIC DM, Type 2: Reports prior A1c 9.4, has improved to A1c 7.6 with diet and lifestyle changes reduced carbs and sweets snacks, previously med was ordered for Metformin but did not start this since last visit. Not checking CBG Meds: none Reports  good compliance. Tolerating well w/o side-effects Improved lifestyle diet Denies hypoglycemia, polyuria, visual changes, numbness or tingling.  Health Maintenance: Cologuard ordered.     01/01/2023    4:01 PM 09/25/2022    3:08 PM 01/21/2020    2:23 PM  Depression screen PHQ 2/9  Decreased Interest 2 2 0  Down, Depressed, Hopeless 2 2 0  PHQ - 2 Score 4 4 0  Altered sleeping 3 3 0  Tired, decreased energy 0 2 0  Change in appetite 2 1 0  Feeling bad or failure about yourself  1 1 0  Trouble concentrating 2 2 0  Moving slowly or fidgety/restless 2 2 0  Suicidal thoughts 0 0 0  PHQ-9 Score 14 15 0  Difficult doing work/chores Very difficult Extremely dIfficult Not difficult at all    Social History   Tobacco Use   Smoking status: Never   Smokeless tobacco: Current    Types: Chew   Tobacco comments:    30 years, chewing tobacco  Vaping Use   Vaping Use: Never used  Substance Use Topics   Alcohol use: No    Comment: Last Drink- 02/15/16 (Prior Heavy Use)   Drug use: Yes    Types: Marijuana    Comment: Regular use for pain control and sleep. Last Use- 06/06/16    Review of Systems Per HPI unless specifically indicated above     Objective:    BP 134/86 (BP Location: Left Arm, Patient Position: Sitting, Cuff Size: Normal)   Pulse 86   Temp (!) 97.1 F (36.2 C) (Temporal)   Wt 224 lb (101.6 kg)   SpO2 97%   BMI 35.08 kg/m   Wt Readings from Last 3 Encounters:  01/01/23 224 lb (101.6 kg)  09/25/22 234 lb 6.4 oz (106.3  kg)  01/21/20 250 lb 9.6 oz (113.7 kg)    Physical Exam Vitals and nursing note reviewed.  Constitutional:      General: He is not in acute distress.    Appearance: He is well-developed. He is not diaphoretic.     Comments: Well-appearing, comfortable, cooperative  HENT:     Head: Normocephalic and atraumatic.  Eyes:     General:        Right eye: No discharge.        Left eye: No discharge.     Conjunctiva/sclera: Conjunctivae normal.  Neck:     Thyroid: No thyromegaly.  Cardiovascular:     Rate and Rhythm: Normal rate and regular rhythm.     Pulses: Normal pulses.     Heart sounds: Normal heart sounds. No murmur heard. Pulmonary:     Effort: Pulmonary effort is normal. No respiratory distress.     Breath sounds: Normal breath sounds. No wheezing or rales.  Musculoskeletal:        General: Normal range of motion.     Cervical back: Normal range of motion and neck supple.  Lymphadenopathy:     Cervical: No cervical adenopathy.  Skin:    General: Skin is warm and dry.     Findings: No erythema or rash.  Neurological:     Mental Status: He is alert and oriented to person, place, and time. Mental status is at baseline.  Psychiatric:        Behavior: Behavior normal.     Comments: Well groomed, good eye contact, normal speech and thoughts     Diabetic Foot Exam - Simple   Simple Foot Form Diabetic Foot exam was performed with the following findings: Yes 01/01/2023  4:04 PM  Visual Inspection No deformities, no ulcerations, no other skin breakdown bilaterally: Yes Sensation Testing Intact to touch and monofilament testing bilaterally: Yes Pulse Check Posterior Tibialis and Dorsalis pulse intact bilaterally: Yes Comments      Results for orders placed or performed in visit on 01/01/23  POCT glycosylated hemoglobin (Hb A1C)  Result Value Ref Range   Hemoglobin A1C 7.6 (A) 4.0 - 5.6 %      Assessment & Plan:   Problem List Items Addressed This Visit     Type 2  diabetes mellitus with other specified complication (HCC) - Primary    Significant improvement A1c 9.4 > 7.6 now  Plan: Remain off therapy - he never started Metformin, we can reconsider in future Encouraged continue lifestyle changes with diet, limited soda, also encouraged to start some regular exercise to help with weight, control BP and sugar      Relevant Orders   POCT glycosylated hemoglobin (Hb A1C) (Completed)   Urine Microalbumin w/creat. ratio   Other Visit Diagnoses     Screening for colon cancer       Relevant Orders   Cologuard       Orders Placed This Encounter  Procedures   Cologuard   Urine Microalbumin w/creat. ratio   POCT glycosylated hemoglobin (Hb A1C)     No orders of the defined types were placed in this encounter.     Follow up plan: Return in about 4 months (around 05/03/2023) for 4 month DM A1c .   Saralyn Pilar, DO [facility] 01/01/2023, 3:52 PM

## 2023-01-02 ENCOUNTER — Encounter: Payer: Self-pay | Admitting: Family Medicine

## 2023-01-02 LAB — MICROALBUMIN / CREATININE URINE RATIO
Creatinine, Urine: 195 mg/dL (ref 20–320)
Microalb Creat Ratio: 5 mg/g creat (ref <30)
Microalb, Ur: 1 mg/dL

## 2023-01-02 NOTE — Assessment & Plan Note (Signed)
Significant improvement A1c 9.4 > 7.6 now  Plan: Remain off therapy - he never started Metformin, we can reconsider in future Encouraged continue lifestyle changes with diet, limited soda, also encouraged to start some regular exercise to help with weight, control BP and sugar

## 2023-01-09 DIAGNOSIS — Z1211 Encounter for screening for malignant neoplasm of colon: Secondary | ICD-10-CM | POA: Diagnosis not present

## 2023-01-15 LAB — COLOGUARD: COLOGUARD: POSITIVE — AB

## 2023-01-16 ENCOUNTER — Other Ambulatory Visit: Payer: Self-pay | Admitting: Family Medicine

## 2023-01-16 DIAGNOSIS — R195 Other fecal abnormalities: Secondary | ICD-10-CM

## 2023-01-20 ENCOUNTER — Other Ambulatory Visit: Payer: Self-pay | Admitting: *Deleted

## 2023-01-20 ENCOUNTER — Telehealth: Payer: Self-pay

## 2023-01-20 ENCOUNTER — Telehealth: Payer: Self-pay | Admitting: *Deleted

## 2023-01-20 DIAGNOSIS — R195 Other fecal abnormalities: Secondary | ICD-10-CM

## 2023-01-20 MED ORDER — NA SULFATE-K SULFATE-MG SULF 17.5-3.13-1.6 GM/177ML PO SOLN: 1.0000 | Freq: Once | ORAL | 0 refills | Status: AC

## 2023-01-20 NOTE — Telephone Encounter (Signed)
Gastroenterology Pre-Procedure Review  Request Date: 02/11/2023 Requesting Physician: Dr. Tobi Bastos  PATIENT REVIEW QUESTIONS: The patient responded to the following health history questions as indicated:    1. Are you having any GI issues? no 2. Do you have a personal history of Polyps? no 3. Do you have a family history of Colon Cancer or Polyps? no 4. Diabetes Mellitus? no 5. Joint replacements in the past 12 months?no 6. Major health problems in the past 3 months?no 7. Any artificial heart valves, MVP, or defibrillator?no    MEDICATIONS & ALLERGIES:    Patient reports the following regarding taking any anticoagulation/antiplatelet therapy:   Plavix, Coumadin, Eliquis, Xarelto, Lovenox, Pradaxa, Brilinta, or Effient? no Aspirin? no  Patient confirms/reports the following medications:  No current outpatient medications on file.   No current facility-administered medications for this visit.    Patient confirms/reports the following allergies:  Allergies  Allergen Reactions   Aspirin Nausea Only and Anxiety    No orders of the defined types were placed in this encounter.   AUTHORIZATION INFORMATION Primary Insurance: 1D#: Group #:  Secondary Insurance: 1D#: Group #:  SCHEDULE INFORMATION: Date: 02/11/2023 Time: Location:  [facility]

## 2023-01-20 NOTE — Telephone Encounter (Signed)
Pt left message to schedule colonoscopy please return call  

## 2023-01-20 NOTE — Telephone Encounter (Signed)
Colonoscopy schedule with Dr Tobi Bastos on 02/11/2023

## 2023-02-10 ENCOUNTER — Encounter: Payer: Self-pay | Admitting: Gastroenterology

## 2023-02-11 ENCOUNTER — Ambulatory Visit
Admission: RE | Admit: 2023-02-11 | Discharge: 2023-02-11 | Disposition: A | Discharge status: Home / Self Care | Payer: Medicare Other | Attending: Gastroenterology | Admitting: Gastroenterology

## 2023-02-11 ENCOUNTER — Ambulatory Visit: Payer: Medicare Other | Admitting: Registered Nurse

## 2023-02-11 ENCOUNTER — Encounter: Payer: Self-pay | Admitting: Gastroenterology

## 2023-02-11 ENCOUNTER — Encounter
Admission: RE | Disposition: A | Discharge status: Home / Self Care | Payer: Self-pay | Source: Home / Self Care | Attending: Gastroenterology

## 2023-02-11 DIAGNOSIS — F17220 Nicotine dependence, chewing tobacco, uncomplicated: Secondary | ICD-10-CM | POA: Insufficient documentation

## 2023-02-11 DIAGNOSIS — R195 Other fecal abnormalities: Secondary | ICD-10-CM

## 2023-02-11 DIAGNOSIS — E119 Type 2 diabetes mellitus without complications: Secondary | ICD-10-CM | POA: Diagnosis not present

## 2023-02-11 DIAGNOSIS — I10 Essential (primary) hypertension: Secondary | ICD-10-CM | POA: Diagnosis not present

## 2023-02-11 DIAGNOSIS — F419 Anxiety disorder, unspecified: Secondary | ICD-10-CM | POA: Insufficient documentation

## 2023-02-11 DIAGNOSIS — K219 Gastro-esophageal reflux disease without esophagitis: Secondary | ICD-10-CM | POA: Diagnosis not present

## 2023-02-11 DIAGNOSIS — Z1211 Encounter for screening for malignant neoplasm of colon: Secondary | ICD-10-CM | POA: Diagnosis not present

## 2023-02-11 DIAGNOSIS — F32A Depression, unspecified: Secondary | ICD-10-CM | POA: Diagnosis not present

## 2023-02-11 DIAGNOSIS — F4310 Post-traumatic stress disorder, unspecified: Secondary | ICD-10-CM | POA: Diagnosis not present

## 2023-02-11 HISTORY — PX: COLONOSCOPY WITH PROPOFOL: SHX5780

## 2023-02-11 SURGERY — COLONOSCOPY WITH PROPOFOL
Anesthesia: General

## 2023-02-11 MED ORDER — PROPOFOL 500 MG/50ML IV EMUL
INTRAVENOUS | Status: DC | PRN
  Administered 2023-02-11: 125 ug/kg/min via INTRAVENOUS

## 2023-02-11 MED ORDER — SODIUM CHLORIDE 0.9 % IV SOLN: INTRAVENOUS | Status: DC

## 2023-02-11 MED ORDER — PROPOFOL 10 MG/ML IV BOLUS
INTRAVENOUS | Status: DC | PRN
Start: 2023-02-11 — End: 2023-02-11
  Administered 2023-02-11 (×2): 100 mg via INTRAVENOUS

## 2023-02-11 MED ORDER — LIDOCAINE HCL (CARDIAC) PF 100 MG/5ML IV SOSY
PREFILLED_SYRINGE | INTRAVENOUS | Status: DC | PRN
  Administered 2023-02-11: 20 mg via INTRAVENOUS

## 2023-02-11 MED ORDER — PROPOFOL 10 MG/ML IV BOLUS
INTRAVENOUS | Status: AC
  Filled 2023-02-11: qty 20

## 2023-02-11 NOTE — Transfer of Care (Signed)
Immediate Anesthesia Transfer of Care Note  Patient: Cody Harrison  Procedure(s) Performed: COLONOSCOPY WITH PROPOFOL  Patient Location: PACU  Anesthesia Type:General  Level of Consciousness: drowsy  Airway & Oxygen Therapy: Patient Spontanous Breathing  Post-op Assessment: Report given to RN and Post -op Vital signs reviewed and stable  Post vital signs: Reviewed and stable  Last Vitals:  Vitals Value Taken Time  BP 120/79 02/11/23 0833  Temp 36.4 C 02/11/23 0833  Pulse 98 02/11/23 0835  Resp 20 02/11/23 0835  SpO2 97 % 02/11/23 0835  Vitals shown include unfiled device data.  Last Pain:  Vitals:   02/11/23 0833  TempSrc: Temporal  PainSc: 0-No pain         Complications: No notable events documented.
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
## 2023-02-11 NOTE — Op Note (Signed)
[facility] Gastroenterology Patient Name: Cody Harrison Procedure Date: 02/11/2023 8:14 AM MRN: [id number] Account #: [id number] Date of Birth: [birth year] Admit Type: Outpatient Age: 47 Room: [facility] ENDO ROOM 2 Gender: Male Note Status: Finalized Instrument Name: Prentice Docker [id number] Procedure:             Colonoscopy Indications:           Screening for colorectal malignant neoplasm due to                         positive Cologuard test Providers:             Wyline Mood MD, MD Referring MD:          Smitty Cords (Referring MD) Medicines:             Monitored Anesthesia Care Complications:         No immediate complications. Procedure:             Pre-Anesthesia Assessment:                        - Prior to the procedure, a History and Physical was                         performed, and patient medications, allergies and                         sensitivities were reviewed. The patient's tolerance                         of previous anesthesia was reviewed.                        - The risks and benefits of the procedure and the                         sedation options and risks were discussed with the                         patient. All questions were answered and informed                         consent was obtained.                        - ASA Grade Assessment: II - A patient with mild                         systemic disease.                        After obtaining informed consent, the colonoscope was                         passed under direct vision. Throughout the procedure,                         the patient's blood pressure, pulse, and oxygen                         saturations were monitored  continuously. The                         Colonoscope was introduced through the anus with the                         intention of advancing to the cecum. The scope was                         advanced to the sigmoid colon before the procedure was                          aborted. Medications were given. The colonoscopy was                         performed with ease. The patient tolerated the                         procedure well. The quality of the bowel preparation                         was inadequate. Findings:      Copious quantities of semi-liquid stool was found in the rectum and in       the sigmoid colon.      The perianal and digital rectal examinations were normal. Impression:            - Preparation of the colon was inadequate.                        - Stool in the rectum and in the sigmoid colon.                        - No specimens collected. Recommendation:        - Discharge patient to home (with escort).                        - Resume previous diet.                        - Continue present medications.                        - Repeat colonoscopy tomorrow because the bowel                         preparation was suboptimal. Procedure Code(s):     --- Professional ---                        [phone], 53, Colonoscopy, flexible; diagnostic,                         including collection of specimen(s) by brushing or                         washing, when performed (separate procedure) Diagnosis Code(s):     --- Professional ---                        Z12.11, Encounter for screening for  malignant neoplasm                         of colon                        R19.5, Other fecal abnormalities CPT copyright 2022 American Medical Association. All rights reserved. The codes documented in this report are preliminary and upon coder review may  be revised to meet current compliance requirements. Wyline Mood, MD Wyline Mood MD, MD 02/11/2023 8:30:32 AM This report has been signed electronically. Number of Addenda: 0 Note Initiated On: 02/11/2023 8:14 AM Total Procedure Duration: 0 hours 0 minutes 57 seconds  Estimated Blood Loss:  Estimated blood loss: none.      [facility]

## 2023-02-12 ENCOUNTER — Encounter: Payer: Self-pay | Admitting: Gastroenterology

## 2023-02-12 NOTE — Anesthesia Postprocedure Evaluation (Signed)
Anesthesia Post Note  Patient: Cody Harrison  Procedure(s) Performed: COLONOSCOPY WITH PROPOFOL  Patient location during evaluation: Endoscopy Anesthesia Type: General Level of consciousness: awake and alert Pain management: pain level controlled Vital Signs Assessment: post-procedure vital signs reviewed and stable Respiratory status: spontaneous breathing, nonlabored ventilation, respiratory function stable and patient connected to nasal cannula oxygen Cardiovascular status: blood pressure returned to baseline and stable Postop Assessment: no apparent nausea or vomiting Anesthetic complications: no   No notable events documented.   Last Vitals:  Vitals:   02/11/23 0751 02/11/23 0833  BP: (!) 145/96 120/79  Pulse: 93 (!) 101  Resp: 18 19  Temp: (!) 35.7 C (!) 36.4 C  SpO2: 100% 97%    Last Pain:  Vitals:   02/11/23 0853  TempSrc:   PainSc: 0-No pain                 Lenard Simmer

## 2023-02-25 ENCOUNTER — Ambulatory Visit (INDEPENDENT_AMBULATORY_CARE_PROVIDER_SITE_OTHER): Payer: Medicare Other | Admitting: Urology

## 2023-02-25 ENCOUNTER — Encounter: Payer: Self-pay | Admitting: Urology

## 2023-02-25 VITALS — BP 160/102 | HR 101 | Ht 66.0 in | Wt 224.2 lb

## 2023-02-25 DIAGNOSIS — N2889 Other specified disorders of kidney and ureter: Secondary | ICD-10-CM

## 2023-02-25 DIAGNOSIS — N281 Cyst of kidney, acquired: Secondary | ICD-10-CM
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
## 2023-03-05 ENCOUNTER — Encounter: Payer: Self-pay | Admitting: Family Medicine

## 2023-03-06 ENCOUNTER — Ambulatory Visit (INDEPENDENT_AMBULATORY_CARE_PROVIDER_SITE_OTHER): Payer: Medicare Other | Admitting: *Deleted

## 2023-03-06 DIAGNOSIS — Z0000 Encounter for general adult medical examination without abnormal findings: Secondary | ICD-10-CM
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
## 2023-03-06 NOTE — Patient Instructions (Signed)
Mr. Cockcroft , Thank you for taking time to come for your Medicare Wellness Visit. I appreciate your ongoing commitment to your health goals. Please review the following plan we discussed and let me know if I can assist you in the future.   Screening recommendations/referrals: Colonoscopy: Education provided Recommended yearly ophthalmology/optometry visit for glaucoma screening and checkup Recommended yearly dental visit for hygiene and checkup  Vaccinations: Influenza vaccine: up to date  Tdap vaccine: up to date     Advanced directives: Education provided   Preventive Care 40-64 Years, Male Preventive care refers to lifestyle choices and visits with your health care provider that can promote health and wellness. What does preventive care include? A yearly physical exam. This is also called an annual well check. Dental exams once or twice a year. Routine eye exams. Ask your health care provider how often you should have your eyes checked. Personal lifestyle choices, including: Daily care of your teeth and gums. Regular physical activity. Eating a healthy diet. Avoiding tobacco and drug use. Limiting alcohol use. Practicing safe sex. Taking low-dose aspirin every day starting at age 74. What happens during an annual well check? The services and screenings done by your health care provider during your annual well check will depend on your age, overall health, lifestyle risk factors, and family history of disease. Counseling  Your health care provider may ask you questions about your: Alcohol use. Tobacco use. Drug use. Emotional well-being. Home and relationship well-being. Sexual activity. Eating habits. Work and work Astronomer. Screening  You may have the following tests or measurements: Height, weight, and BMI. Blood pressure. Lipid and cholesterol levels. These may be checked every 5 years, or more frequently if you are over 19 years old. Skin check. Lung cancer  screening. You may have this screening every year starting at age 41 if you have a 30-pack-year history of smoking and currently smoke or have quit within the past 15 years. Fecal occult blood test (FOBT) of the stool. You may have this test every year starting at age 51. Flexible sigmoidoscopy or colonoscopy. You may have a sigmoidoscopy every 5 years or a colonoscopy every 10 years starting at age 25. Prostate cancer screening. Recommendations will vary depending on your family history and other risks. Hepatitis C blood test. Hepatitis B blood test. Sexually transmitted disease (STD) testing. Diabetes screening. This is done by checking your blood sugar (glucose) after you have not eaten for a while (fasting). You may have this done every 1-3 years. Discuss your test results, treatment options, and if necessary, the need for more tests with your health care provider. Vaccines  Your health care provider may recommend certain vaccines, such as: Influenza vaccine. This is recommended every year. Tetanus, diphtheria, and acellular pertussis (Tdap, Td) vaccine. You may need a Td booster every 10 years. Zoster vaccine. You may need this after age 59. Pneumococcal 13-valent conjugate (PCV13) vaccine. You may need this if you have certain conditions and have not been vaccinated. Pneumococcal polysaccharide (PPSV23) vaccine. You may need one or two doses if you smoke cigarettes or if you have certain conditions. Talk to your health care provider about which screenings and vaccines you need and how often you need them. This information is not intended to replace advice given to you by your health care provider. Make sure you discuss any questions you have with your health care provider. Document Released: 08/04/2015 Document Revised: 03/27/2016 Document Reviewed: 05/09/2015 Elsevier Interactive Patient Education  2017 [employer].  Fall Prevention in the Home Falls can cause injuries. They can happen  to people of all ages. There are many things you can do to make your home safe and to help prevent falls. What can I do on the outside of my home? Regularly fix the edges of walkways and driveways and fix any cracks. Remove anything that might make you trip as you walk through a door, such as a raised step or threshold. Trim any bushes or trees on the path to your home. Use bright outdoor lighting. Clear any walking paths of anything that might make someone trip, such as rocks or tools. Regularly check to see if handrails are loose or broken. Make sure that both sides of any steps have handrails. Any raised decks and porches should have guardrails on the edges. Have any leaves, snow, or ice cleared regularly. Use sand or salt on walking paths during winter. Clean up any spills in your garage right away. This includes oil or grease spills. What can I do in the bathroom? Use night lights. Install grab bars by the toilet and in the tub and shower. Do not use towel bars as grab bars. Use non-skid mats or decals in the tub or shower. If you need to sit down in the shower, use a plastic, non-slip stool. Keep the floor dry. Clean up any water that spills on the floor as soon as it happens. Remove soap buildup in the tub or shower regularly. Attach bath mats securely with double-sided non-slip rug tape. Do not have throw rugs and other things on the floor that can make you trip. What can I do in the bedroom? Use night lights. Make sure that you have a light by your bed that is easy to reach. Do not use any sheets or blankets that are too big for your bed. They should not hang down onto the floor. Have a firm chair that has side arms. You can use this for support while you get dressed. Do not have throw rugs and other things on the floor that can make you trip. What can I do in the kitchen? Clean up any spills right away. Avoid walking on wet floors. Keep items that you use a lot in  easy-to-reach places. If you need to reach something above you, use a strong step stool that has a grab bar. Keep electrical cords out of the way. Do not use floor polish or wax that makes floors slippery. If you must use wax, use non-skid floor wax. Do not have throw rugs and other things on the floor that can make you trip. What can I do with my stairs? Do not leave any items on the stairs. Make sure that there are handrails on both sides of the stairs and use them. Fix handrails that are broken or loose. Make sure that handrails are as long as the stairways. Check any carpeting to make sure that it is firmly attached to the stairs. Fix any carpet that is loose or worn. Avoid having throw rugs at the top or bottom of the stairs. If you do have throw rugs, attach them to the floor with carpet tape. Make sure that you have a light switch at the top of the stairs and the bottom of the stairs. If you do not have them, ask someone to add them for you. What else can I do to help prevent falls? Wear shoes that: Do not have high heels. Have rubber bottoms. Are comfortable and fit  you well. Are closed at the toe. Do not wear sandals. If you use a stepladder: Make sure that it is fully opened. Do not climb a closed stepladder. Make sure that both sides of the stepladder are locked into place. Ask someone to hold it for you, if possible. Clearly mark and make sure that you can see: Any grab bars or handrails. First and last steps. Where the edge of each step is. Use tools that help you move around (mobility aids) if they are needed. These include: Canes. Walkers. Scooters. Crutches. Turn on the lights when you go into a dark area. Replace any light bulbs as soon as they burn out. Set up your furniture so you have a clear path. Avoid moving your furniture around. If any of your floors are uneven, fix them. If there are any pets around you, be aware of where they are. Review your medicines  with your doctor. Some medicines can make you feel dizzy. This can increase your chance of falling. Ask your doctor what other things that you can do to help prevent falls. This information is not intended to replace advice given to you by your health care provider. Make sure you discuss any questions you have with your health care provider. Document Released: 05/04/2009 Document Revised: 12/14/2015 Document Reviewed: 08/12/2014 Elsevier Interactive Patient Education  2017 [employer].

## 2023-03-12 DIAGNOSIS — E119 Type 2 diabetes mellitus without complications: Secondary | ICD-10-CM | POA: Diagnosis not present

## 2023-03-12 DIAGNOSIS — H40003 Preglaucoma, unspecified, bilateral: Secondary | ICD-10-CM | POA: Diagnosis not present

## 2023-03-12 LAB — HM DIABETES EYE EXAM

## 2023-03-19 ENCOUNTER — Encounter: Payer: Self-pay | Admitting: Family Medicine

## 2023-03-20 ENCOUNTER — Ambulatory Visit
Admission: RE | Admit: 2023-03-20 | Discharge: 2023-03-20 | Disposition: A | Discharge status: Home / Self Care | Payer: Medicare Other | Source: Ambulatory Visit | Attending: Urology | Admitting: Urology

## 2023-03-20 DIAGNOSIS — N281 Cyst of kidney, acquired: Secondary | ICD-10-CM | POA: Insufficient documentation

## 2023-03-20 DIAGNOSIS — K760 Fatty (change of) liver, not elsewhere classified: Secondary | ICD-10-CM | POA: Diagnosis not present

## 2023-03-20 DIAGNOSIS — R161 Splenomegaly, not elsewhere classified: Secondary | ICD-10-CM | POA: Diagnosis not present

## 2023-03-20 DIAGNOSIS — N289 Disorder of kidney and ureter, unspecified: Secondary | ICD-10-CM | POA: Diagnosis not present

## 2023-03-20 MED ORDER — GADOBUTROL 1 MMOL/ML IV SOLN
10.0000 mL | Freq: Once | INTRAVENOUS | Status: AC | PRN
  Administered 2023-03-20: 10 mL via INTRAVENOUS

## 2023-03-27 ENCOUNTER — Telehealth: Payer: Self-pay

## 2023-03-27 ENCOUNTER — Other Ambulatory Visit: Payer: Self-pay

## 2023-03-27 DIAGNOSIS — R195 Other fecal abnormalities: Secondary | ICD-10-CM

## 2023-03-27 DIAGNOSIS — Z1211 Encounter for screening for malignant neoplasm of colon: Secondary | ICD-10-CM

## 2023-03-27 MED ORDER — NA SULFATE-K SULFATE-MG SULF 17.5-3.13-1.6 GM/177ML PO SOLN: 1.0000 | Freq: Once | ORAL | 0 refills | Status: AC

## 2023-03-27 NOTE — Telephone Encounter (Signed)
Colonoscopy has been scheduled for 05/01/23 with Dr. Tobi Bastos at [facility]. 2 day prep with Miralax and Gatorade and Suprep.  Thanks,  [place], [place]

## 2023-05-01 ENCOUNTER — Ambulatory Visit
Admission: RE | Admit: 2023-05-01 | Discharge: 2023-05-01 | Disposition: A | Discharge status: Home / Self Care | Payer: Medicare Other | Attending: Gastroenterology | Admitting: Gastroenterology

## 2023-05-01 ENCOUNTER — Ambulatory Visit: Payer: Medicare Other | Admitting: Anesthesiology

## 2023-05-01 ENCOUNTER — Encounter: Payer: Self-pay | Admitting: Gastroenterology

## 2023-05-01 ENCOUNTER — Encounter
Admission: RE | Disposition: A | Discharge status: Home / Self Care | Payer: Self-pay | Source: Home / Self Care | Attending: Gastroenterology

## 2023-05-01 DIAGNOSIS — R195 Other fecal abnormalities: Secondary | ICD-10-CM | POA: Diagnosis not present

## 2023-05-01 DIAGNOSIS — D122 Benign neoplasm of ascending colon: Secondary | ICD-10-CM | POA: Diagnosis not present

## 2023-05-01 DIAGNOSIS — F418 Other specified anxiety disorders: Secondary | ICD-10-CM | POA: Diagnosis not present

## 2023-05-01 DIAGNOSIS — D124 Benign neoplasm of descending colon: Secondary | ICD-10-CM | POA: Diagnosis not present

## 2023-05-01 DIAGNOSIS — D126 Benign neoplasm of colon, unspecified: Secondary | ICD-10-CM

## 2023-05-01 DIAGNOSIS — K635 Polyp of colon: Secondary | ICD-10-CM | POA: Diagnosis not present

## 2023-05-01 DIAGNOSIS — Z1211 Encounter for screening for malignant neoplasm of colon: Secondary | ICD-10-CM

## 2023-05-01 HISTORY — PX: COLONOSCOPY WITH PROPOFOL: SHX5780

## 2023-05-01 HISTORY — PX: POLYPECTOMY: SHX5525

## 2023-05-01 SURGERY — COLONOSCOPY WITH PROPOFOL
Anesthesia: General

## 2023-05-01 MED ORDER — MIDAZOLAM HCL 2 MG/2ML IJ SOLN
INTRAMUSCULAR | Status: AC
  Filled 2023-05-01: qty 2

## 2023-05-01 MED ORDER — PROPOFOL 10 MG/ML IV BOLUS
INTRAVENOUS | Status: DC | PRN
  Administered 2023-05-01: 20 mg via INTRAVENOUS
  Administered 2023-05-01: 70 mg via INTRAVENOUS
  Administered 2023-05-01: 20 mg via INTRAVENOUS
  Administered 2023-05-01: 30 mg via INTRAVENOUS

## 2023-05-01 MED ORDER — SODIUM CHLORIDE 0.9 % IV SOLN: INTRAVENOUS | Status: DC

## 2023-05-01 MED ORDER — PROPOFOL 10 MG/ML IV BOLUS
INTRAVENOUS | Status: AC
  Filled 2023-05-01: qty 20

## 2023-05-01 MED ORDER — DEXMEDETOMIDINE HCL IN NACL 200 MCG/50ML IV SOLN
INTRAVENOUS | Status: DC | PRN
Start: 2023-05-01 — End: 2023-05-01
  Administered 2023-05-01: 12 ug via INTRAVENOUS

## 2023-05-01 MED ORDER — PROPOFOL 500 MG/50ML IV EMUL
INTRAVENOUS | Status: DC | PRN
  Administered 2023-05-01: 165 ug/kg/min via INTRAVENOUS

## 2023-05-01 MED ORDER — MIDAZOLAM HCL 2 MG/2ML IJ SOLN
INTRAMUSCULAR | Status: DC | PRN
  Administered 2023-05-01: 2 mg via INTRAVENOUS

## 2023-05-01 MED ORDER — LIDOCAINE HCL (CARDIAC) PF 100 MG/5ML IV SOSY
PREFILLED_SYRINGE | INTRAVENOUS | Status: DC | PRN
  Administered 2023-05-01: 100 mg via INTRAVENOUS

## 2023-05-01 NOTE — Op Note (Signed)
[facility] Gastroenterology Patient Name: Cody Harrison Procedure Date: 05/01/2023 10:17 AM MRN: [id number] Account #: [id number] Date of Birth: [birth year] Admit Type: Outpatient Age: 47 Room: [facility] ENDO ROOM 2 Gender: Male Note Status: Finalized Instrument Name: Prentice Docker [id number] Procedure:             Colonoscopy Indications:           Screening for colorectal malignant neoplasm due to                         positive Cologuard test Providers:             Wyline Mood MD, MD Referring MD:          Smitty Cords (Referring MD) Medicines:             Monitored Anesthesia Care Complications:         No immediate complications. Procedure:             Pre-Anesthesia Assessment:                        - Prior to the procedure, a History and Physical was                         performed, and patient medications, allergies and                         sensitivities were reviewed. The patient's tolerance                         of previous anesthesia was reviewed.                        - The risks and benefits of the procedure and the                         sedation options and risks were discussed with the                         patient. All questions were answered and informed                         consent was obtained.                        - ASA Grade Assessment: II - A patient with mild                         systemic disease.                        After obtaining informed consent, the colonoscope was                         passed under direct vision. Throughout the procedure,                         the patient's blood pressure, pulse, and oxygen                         saturations were monitored  continuously. The                         Colonoscope was introduced through the anus and                         advanced to the the cecum, identified by the                         appendiceal orifice. The colonoscopy was performed                          with ease. The patient tolerated the procedure well.                         The quality of the bowel preparation was excellent.                         The ileocecal valve, appendiceal orifice, and rectum                         were photographed. Findings:      The perianal and digital rectal examinations were normal.      A 5 mm polyp was found in the descending colon. The polyp was sessile.       The polyp was removed with a cold snare. Resection and retrieval were       complete.      Three sessile polyps were found in the ascending colon. The polyps were       5 to 6 mm in size. These polyps were removed with a cold snare.       Resection and retrieval were complete.      The exam was otherwise without abnormality on direct and retroflexion       views. Impression:            - One 5 mm polyp in the descending colon, removed with                         a cold snare. Resected and retrieved.                        - Three 5 to 6 mm polyps in the ascending colon,                         removed with a cold snare. Resected and retrieved.                        - The examination was otherwise normal on direct and                         retroflexion views. Recommendation:        - Discharge patient to home (with escort).                        - Resume previous diet.                        - Continue present medications.                        -  Await pathology results.                        - Repeat colonoscopy for surveillance based on                         pathology results. Procedure Code(s):     --- Professional ---                        [phone], Colonoscopy, flexible; with removal of                         tumor(s), polyp(s), or other lesion(s) by snare                         technique Diagnosis Code(s):     --- Professional ---                        Z12.11, Encounter for screening for malignant neoplasm                         of colon                         R19.5, Other fecal abnormalities                        D12.4, Benign neoplasm of descending colon                        D12.2, Benign neoplasm of ascending colon CPT copyright 2022 American Medical Association. All rights reserved. The codes documented in this report are preliminary and upon coder review may  be revised to meet current compliance requirements. Wyline Mood, MD Wyline Mood MD, MD 05/01/2023 10:40:36 AM This report has been signed electronically. Number of Addenda: 0 Note Initiated On: 05/01/2023 10:17 AM Scope Withdrawal Time: 0 hours 14 minutes 42 seconds  Total Procedure Duration: 0 hours 17 minutes 8 seconds  Estimated Blood Loss:  Estimated blood loss: none.      [facility]

## 2023-05-01 NOTE — Transfer of Care (Signed)
Immediate Anesthesia Transfer of Care Note  Patient: Cody Harrison  Procedure(s) Performed: COLONOSCOPY WITH PROPOFOL POLYPECTOMY  Patient Location: Endoscopy Unit  Anesthesia Type:General  Level of Consciousness: awake, drowsy, and patient cooperative  Airway & Oxygen Therapy: Patient Spontanous Breathing and Patient connected to face mask oxygen  Post-op Assessment: Report given to RN and Post -op Vital signs reviewed and stable  Post vital signs: Reviewed and stable  Last Vitals:  Vitals Value Taken Time  BP 141/105 05/01/23 1043  Temp 36.4 C 05/01/23 1042  Pulse 104 05/01/23 1045  Resp 20 05/01/23 1045  SpO2 97 % 05/01/23 1045  Vitals shown include unfiled device data.  Last Pain:  Vitals:   05/01/23 1042  TempSrc:   PainSc: 0-No pain         Complications: No notable events documented.

## 2023-05-01 NOTE — Anesthesia Procedure Notes (Addendum)
Procedure Name: General with mask airway Date/Time: 05/01/2023 10:30 AM  Performed by: Mohammed Kindle, CRNAPre-anesthesia Checklist: Patient identified, Emergency Drugs available, Suction available and Patient being monitored Patient Re-evaluated:Patient Re-evaluated prior to induction Oxygen Delivery Method: Simple face mask Induction Type: IV induction Placement Confirmation: positive ETCO2, CO2 detector and breath sounds checked- equal and bilateral Dental Injury: Teeth and Oropharynx as per pre-operative assessment
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
## 2023-05-02 ENCOUNTER — Encounter: Payer: Self-pay | Admitting: Gastroenterology

## 2023-05-02 LAB — SURGICAL PATHOLOGY

## 2023-05-03 ENCOUNTER — Encounter: Payer: Self-pay | Admitting: Gastroenterology

## 2023-05-09 NOTE — Anesthesia Postprocedure Evaluation (Signed)
Anesthesia Post Note  Patient: Cody Harrison  Procedure(s) Performed: COLONOSCOPY WITH PROPOFOL POLYPECTOMY  Patient location during evaluation: PACU Anesthesia Type: General Level of consciousness: awake and alert Pain management: pain level controlled Vital Signs Assessment: post-procedure vital signs reviewed and stable Respiratory status: spontaneous breathing, nonlabored ventilation, respiratory function stable and patient connected to nasal cannula oxygen Cardiovascular status: blood pressure returned to baseline and stable Postop Assessment: no apparent nausea or vomiting Anesthetic complications: no   No notable events documented.   Last Vitals:  Vitals:   05/01/23 1052 05/01/23 1102  BP: (!) 138/93 129/89  Pulse:    Resp:    Temp:    SpO2:      Last Pain:  Vitals:   05/01/23 1102  TempSrc:   PainSc: 0-No pain                 Yevette Edwards

## 2023-05-12 ENCOUNTER — Ambulatory Visit: Payer: Medicare Other | Admitting: Family Medicine

## 2023-05-12 ENCOUNTER — Encounter: Payer: Self-pay | Admitting: Family Medicine

## 2023-05-12 ENCOUNTER — Other Ambulatory Visit: Payer: Self-pay | Admitting: Family Medicine

## 2023-05-12 VITALS — BP 140/84 | HR 107 | Wt 217.0 lb

## 2023-05-12 DIAGNOSIS — H6123 Impacted cerumen, bilateral: Secondary | ICD-10-CM | POA: Diagnosis not present

## 2023-05-12 DIAGNOSIS — E1169 Type 2 diabetes mellitus with other specified complication: Secondary | ICD-10-CM | POA: Diagnosis not present

## 2023-05-12 DIAGNOSIS — R030 Elevated blood-pressure reading, without diagnosis of hypertension: Secondary | ICD-10-CM | POA: Diagnosis not present

## 2023-05-12 DIAGNOSIS — R351 Nocturia: Secondary | ICD-10-CM

## 2023-05-12 DIAGNOSIS — E782 Mixed hyperlipidemia: Secondary | ICD-10-CM

## 2023-05-12 LAB — POCT GLYCOSYLATED HEMOGLOBIN (HGB A1C): Hemoglobin A1C: 6 % — AB (ref 4.0–5.6)

## 2023-05-12 NOTE — Patient Instructions (Addendum)
Thank you for coming to the office today.  Recent Labs    09/26/22 0932 01/01/23 1547 05/12/23 1502  HGBA1C 9.4* 7.6* 6.0*   DUE for FASTING BLOOD WORK (no food or drink after midnight before the lab appointment, only water or coffee without cream/sugar on the morning of)  SCHEDULE "Lab Only" visit in the morning at the clinic for lab draw in 6 MONTHS   - Make sure Lab Only appointment is at about 1 week before your next appointment, so that results will be available  For Lab Results, once available within 2-3 days of blood draw, you can can log in to MyChart online to view your results and a brief explanation. Also, we can discuss results at next follow-up visit.   Please schedule a Follow-up Appointment to: Return in about 6 months (around 11/10/2023) for 6 month fasting lab then 1 week later Yearly Medicare Physical (Mid afternoon any day).  If you have any other questions or concerns, please feel free to call the office or send a message through MyChart. You may also schedule an earlier appointment if necessary.  Additionally, you may be receiving a survey about your experience at our office within a few days to 1 week by e-mail or mail. We value your feedback.  Saralyn Pilar, DO [facility], [place]

## 2023-05-12 NOTE — Progress Notes (Signed)
Subjective:    Patient ID: Cody Harrison, male    DOB: 1976-06-25, 47 y.o.   MRN: [id number]  Cody Harrison is a 47 y.o. male presenting on 05/12/2023 for Diabetes   HPI  Discussed the use of AI scribe software for clinical note transcription with the patient, who gave verbal consent to proceed.      CHRONIC DM, Type 2:  - Last visit with me 01/01/23 with A1c 7.6, he has improved diet overall, reduced portions, starches and sweets and improved diet to control his sugar.  He has improved exercise as well with more walking. We did not start any medications last time. Not checking CBG  Today result is A1c 6.0 He lost wt 17 lbs in past 6+ months, down to 217 lbs from 234 lbs  History of Diabetic Eye exam, [facility]. Possible traumatic nerve issue to Left eye. No diabetic retinopathy  He remains off medications Not checking CBG Meds: none Reports  good compliance. Tolerating well w/o side-effects Improved lifestyle diet Denies hypoglycemia, polyuria, visual changes, numbness or tingling.  Bilateral Cerumen impaction Request ear flushing both sides, has pressure fullness of both ears.  Elevated BP Without HYPERTENSION The patient also reported a slightly elevated blood pressure reading during the visit, but did not express any related symptoms. He indicated that he has the ability to monitor his blood pressure at home.   Health Maintenance:  Colonoscopy last done 05/01/23 Dr Tobi Bastos [facility], showed multiple polyps, precancerous, repeat 3 years. (Note he initially did cologuard that was positive)     05/12/2023    2:58 PM 03/06/2023    3:11 PM 01/01/2023    4:01 PM  Depression screen PHQ 2/9  Decreased Interest 1 0 2  Down, Depressed, Hopeless 2 0 2  PHQ - 2 Score 3 0 4  Altered sleeping 3 3 3   Tired, decreased energy 1 0 0  Change in appetite 0 0 2  Feeling bad or failure about yourself  0 0 1  Trouble concentrating 1 1 2   Moving slowly or  fidgety/restless 0 1 2  Suicidal thoughts 0 0 0  PHQ-9 Score 8 5 14   Difficult doing work/chores  Somewhat difficult Very difficult    Social History   Tobacco Use   Smoking status: Never   Smokeless tobacco: Current    Types: Chew   Tobacco comments:    30 years, chewing tobacco  Vaping Use   Vaping status: Never Used  Substance Use Topics   Alcohol use: No    Comment: Last Drink- 02/15/16 (Prior Heavy Use)   Drug use: Yes    Types: Marijuana    Comment: Regular use for pain control and sleep. Last Use- 06/06/16    Review of Systems Per HPI unless specifically indicated above     Objective:    BP (!) 140/84 (BP Location: Left Arm, Cuff Size: Normal)   Pulse (!) 107   Wt 217 lb (98.4 kg)   SpO2 96%   BMI 35.02 kg/m   Wt Readings from Last 3 Encounters:  05/12/23 217 lb (98.4 kg)  05/01/23 226 lb (102.5 kg)  02/25/23 224 lb 4 oz (101.7 kg)    Physical Exam Vitals and nursing note reviewed.  Constitutional:      General: He is not in acute distress.    Appearance: Normal appearance. He is well-developed. He is not diaphoretic.     Comments: Well-appearing, comfortable, cooperative  HENT:  Head: Normocephalic and atraumatic.     Right Ear: There is impacted cerumen.     Left Ear: There is impacted cerumen.  Eyes:     General:        Right eye: No discharge.        Left eye: No discharge.     Conjunctiva/sclera: Conjunctivae normal.  Cardiovascular:     Rate and Rhythm: Normal rate.  Pulmonary:     Effort: Pulmonary effort is normal.  Skin:    General: Skin is warm and dry.     Findings: No erythema or rash.  Neurological:     Mental Status: He is alert and oriented to person, place, and time.  Psychiatric:        Mood and Affect: Mood normal.        Behavior: Behavior normal.        Thought Content: Thought content normal.     Comments: Well groomed, good eye contact, normal speech and thoughts     ________________________________________________________ PROCEDURE NOTE Date: 05/12/23 Bilateral Left Ear Lavage / Cerumen Removal Discussed benefits and risks (including pain / discomforts, dizziness, minor abrasion of ear canal). Verbal consent given by patient. Medication:  carbamide peroxide ear drops, Ear Lavage Solution (warm water + hydrogen peroxide) Performed by Dr Althea Charon and Shirley Muscat CMA Several drops of carbamide peroxide placed in each ear, allowed to sit for few minutes. Ear lavage solution flushed into one ear at a time in attempt to dislodge and remove ear wax. Results were successful.  Repeat Ear Exam: - Completely removed cerumen now, with clear ear canals and visible TMs clear and normal appearance.    I have personally reviewed the radiology report from 03/20/23 on MRI Abdomen.  CLINICAL DATA:  Follow-up complex renal lesion   EXAM: MRI ABDOMEN WITHOUT AND WITH CONTRAST   TECHNIQUE: Multiplanar multisequence MR imaging of the abdomen was performed both before and after the administration of intravenous contrast.   CONTRAST:  10mL GADAVIST GADOBUTROL 1 MMOL/ML IV SOLN   COMPARISON:  04/02/2017   FINDINGS: Lower chest: No acute abnormality.   Hepatobiliary: No solid liver abnormality is seen. Mildly coarse contour of the liver. Mild hepatic steatosis. Contracted gallbladder. No gallstones, gallbladder wall thickening, or biliary dilatation.   Pancreas: Unremarkable. No pancreatic ductal dilatation or surrounding inflammatory changes.   Spleen: Splenomegaly, maximum coronal span 15.0 cm.   Adrenals/Urinary Tract: Adrenal glands are unremarkable. No significant change in a thinly septated cyst of the anterior midportion of the right kidney measuring 3.4 x 2.4 cm (series 4, image 21). Additional small simple renal cortical cysts. Kidneys are otherwise normal, without renal calculi, solid lesion, or hydronephrosis.   Stomach/Bowel: Stomach  is within normal limits. No evidence of bowel wall thickening, distention, or inflammatory changes.   Vascular/Lymphatic: Varices about the left upper quadrant. No enlarged abdominal lymph nodes.   Other: No abdominal wall hernia or abnormality. No ascites.   Musculoskeletal: No acute or significant osseous findings.   IMPRESSION: 1. No significant change in a thinly septated cyst of the anterior midportion of the right kidney. Additional simple fluid signal renal cortical cysts. No further follow-up or characterization is required for these benign Bosniak category I and II cysts. 2. Mild hepatic steatosis. Mildly coarse contour of the liver, suggestive of cirrhosis. 3. Splenomegaly and varices about the left upper quadrant.     Electronically Signed   By: Jearld Lesch M.D.   On: 03/24/2023 14:22   Results for orders  placed or performed in visit on 05/12/23  POCT glycosylated hemoglobin (Hb A1C)  Result Value Ref Range   Hemoglobin A1C 6.0 (A) 4.0 - 5.6 %   HbA1c POC (<> result, manual entry)     HbA1c, POC (prediabetic range)     HbA1c, POC (controlled diabetic range)        Assessment & Plan:   Problem List Items Addressed This Visit     Elevated BP without diagnosis of hypertension   Morbid obesity (HCC)   Type 2 diabetes mellitus with other specified complication (HCC) - Primary   Relevant Orders   POCT glycosylated hemoglobin (Hb A1C) (Completed)   Other Visit Diagnoses     Bilateral impacted cerumen           Assessment and Plan    Type 2 Diabetes Mellitus Improved glycemic control with A1c reduction from 7.6 to 6.0 through dietary modifications. No home glucose monitoring or use of Metformin. -Continue dietary modifications. -Check A1c in 6 months (March/April 2025).  Weight Management Significant weight loss of 17 pounds over the past 6-7 months through increased physical activity and dietary modifications. -Continue current lifestyle  modifications.  Hypertension Elevated blood pressure readings today (144/84). No history of consistent hypertension. Repeat manual reading improved -Check blood pressure at home. -Consider initiation of antihypertensive medication if consistently elevated readings.  Cerumen Impaction Complaints of ear fullness and wet sensation in both ears. Examination revealed significant cerumen in both ears. -Perform ear irrigation today.  Colon Polyps Recent colonoscopy revealed several polyps, some of which were precancerous. -Repeat colonoscopy in 3 years.  Renal Cyst Stable complex renal cyst on recent imaging. -No further characterization required at this time.  Eye Health Recent eye examination revealed possible traumatic nerve issue in left eye. No diabetic changes noted. -No further action required at this time.        No orders of the defined types were placed in this encounter.     Follow up plan: Return in about 6 months (around 11/10/2023) for 6 month fasting lab then 1 week later Yearly Medicare Physical (Mid afternoon any day).  Future labs ordered for 11/10/23   Saralyn Pilar, DO [facility] 05/12/2023, 3:06 PM

## 2023-07-10 DIAGNOSIS — H40003 Preglaucoma, unspecified, bilateral: Secondary | ICD-10-CM | POA: Diagnosis not present

## 2023-11-10 ENCOUNTER — Other Ambulatory Visit: Payer: Self-pay

## 2023-11-10 DIAGNOSIS — R030 Elevated blood-pressure reading, without diagnosis of hypertension: Secondary | ICD-10-CM | POA: Diagnosis not present

## 2023-11-10 DIAGNOSIS — E1169 Type 2 diabetes mellitus with other specified complication: Secondary | ICD-10-CM

## 2023-11-10 DIAGNOSIS — R351 Nocturia: Secondary | ICD-10-CM | POA: Diagnosis not present

## 2023-11-10 DIAGNOSIS — Z6841 Body Mass Index (BMI) 40.0 and over, adult: Secondary | ICD-10-CM | POA: Diagnosis not present

## 2023-11-10 DIAGNOSIS — E782 Mixed hyperlipidemia: Secondary | ICD-10-CM

## 2023-11-11 LAB — HEMOGLOBIN A1C
Hgb A1c MFr Bld: 6.1 % — ABNORMAL HIGH (ref <5.7)
Mean Plasma Glucose: 128 mg/dL
eAG (mmol/L): 7.1 mmol/L

## 2023-11-11 LAB — CBC WITH DIFFERENTIAL/PLATELET
Absolute Lymphocytes: 2255 {cells}/uL (ref 850–3900)
Absolute Monocytes: 611 {cells}/uL (ref 200–950)
Basophils Absolute: 82 {cells}/uL (ref 0–200)
Basophils Relative: 1.3 %
Eosinophils Absolute: 183 {cells}/uL (ref 15–500)
Eosinophils Relative: 2.9 %
HCT: 43.5 % (ref 38.5–50.0)
Hemoglobin: 15.1 g/dL (ref 13.2–17.1)
MCH: 32.8 pg (ref 27.0–33.0)
MCHC: 34.7 g/dL (ref 32.0–36.0)
MCV: 94.4 fL (ref 80.0–100.0)
MPV: 8.8 fL (ref 7.5–12.5)
Monocytes Relative: 9.7 %
Neutro Abs: 3169 {cells}/uL (ref 1500–7800)
Neutrophils Relative %: 50.3 %
Platelets: 124 10*3/uL — ABNORMAL LOW (ref 140–400)
RBC: 4.61 10*6/uL (ref 4.20–5.80)
RDW: 12.6 % (ref 11.0–15.0)
Total Lymphocyte: 35.8 %
WBC: 6.3 10*3/uL (ref 3.8–10.8)

## 2023-11-11 LAB — COMPLETE METABOLIC PANEL WITHOUT GFR
AG Ratio: 1.1 (calc) (ref 1.0–2.5)
ALT: 36 U/L (ref 9–46)
AST: 54 U/L — ABNORMAL HIGH (ref 10–35)
Albumin: 3.8 g/dL (ref 3.6–5.1)
Alkaline phosphatase (APISO): 103 U/L (ref 35–144)
BUN/Creatinine Ratio: 17 (calc) (ref 6–22)
BUN: 9 mg/dL (ref 7–25)
CO2: 26 mmol/L (ref 20–32)
Calcium: 9 mg/dL (ref 8.6–10.3)
Chloride: 106 mmol/L (ref 98–110)
Creat: 0.53 mg/dL — ABNORMAL LOW (ref 0.70–1.30)
Globulin: 3.4 g/dL (ref 1.9–3.7)
Glucose, Bld: 112 mg/dL — ABNORMAL HIGH (ref 65–99)
Potassium: 4.2 mmol/L (ref 3.5–5.3)
Sodium: 140 mmol/L (ref 135–146)
Total Bilirubin: 0.9 mg/dL (ref 0.2–1.2)
Total Protein: 7.2 g/dL (ref 6.1–8.1)

## 2023-11-11 LAB — MICROALBUMIN / CREATININE URINE RATIO
Creatinine, Urine: 169 mg/dL (ref 20–320)
Microalb Creat Ratio: 6 mg/g{creat} (ref <30)
Microalb, Ur: 1 mg/dL

## 2023-11-11 LAB — LIPID PANEL
Cholesterol: 204 mg/dL — ABNORMAL HIGH (ref <200)
HDL: 62 mg/dL (ref >=40)
LDL Cholesterol (Calc): 108 mg/dL — ABNORMAL HIGH
Non-HDL Cholesterol (Calc): 142 mg/dL — ABNORMAL HIGH (ref <130)
Total CHOL/HDL Ratio: 3.3 (calc) (ref <5.0)
Triglycerides: 226 mg/dL — ABNORMAL HIGH (ref <150)

## 2023-11-11 LAB — TSH: TSH: 2.89 m[IU]/L (ref 0.40–4.50)

## 2023-11-11 LAB — PSA: PSA: 1.01 ng/mL (ref <=4.00)

## 2023-11-13 ENCOUNTER — Encounter: Payer: Self-pay | Admitting: Family Medicine

## 2023-11-17 ENCOUNTER — Encounter: Payer: Self-pay | Admitting: Family Medicine

## 2023-11-17 ENCOUNTER — Ambulatory Visit (INDEPENDENT_AMBULATORY_CARE_PROVIDER_SITE_OTHER): Payer: Self-pay | Admitting: Family Medicine

## 2023-11-17 VITALS — BP 158/90 | Ht 66.0 in | Wt 216.0 lb

## 2023-11-17 DIAGNOSIS — E1169 Type 2 diabetes mellitus with other specified complication: Secondary | ICD-10-CM

## 2023-11-17 DIAGNOSIS — I10 Essential (primary) hypertension: Secondary | ICD-10-CM | POA: Diagnosis not present

## 2023-11-17 DIAGNOSIS — F1021 Alcohol dependence, in remission: Secondary | ICD-10-CM

## 2023-11-17 MED ORDER — AMLODIPINE BESYLATE 5 MG PO TABS
5.0000 mg | ORAL_TABLET | Freq: Every day | ORAL | 0 refills | Status: DC
Start: 2023-11-17 — End: 2024-05-18
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
## 2023-11-17 NOTE — Patient Instructions (Addendum)
 Thank you for coming to the office today.  Start new medication for blood pressure Amlodipine 5mg  daily, possible leg swelling, goal to elevate if swelling occurs. We can adjust dose up to 10mg  if not improving or not controlled BP  Recent Labs    01/01/23 1547 05/12/23 1502 11/10/23 0801  HGBA1C 7.6* 6.0* 6.1*   Cholesterol improved  Keep improving lifestyle weight management  Likely nerve pinch or muscle strain with the pain on the coughing.  Please schedule a Follow-up Appointment to: Return for 6 month DM A1c, HTN.  If you have any other questions or concerns, please feel free to call the office or send a message through MyChart. You may also schedule an earlier appointment if necessary.  Additionally, you may be receiving a survey about your experience at our office within a few days to 1 week by e-mail or mail. We value your feedback.  Domingo Friend, DO [facility], [place]

## 2023-11-26 ENCOUNTER — Other Ambulatory Visit

## 2023-12-17 ENCOUNTER — Encounter: Payer: Self-pay | Admitting: Family Medicine

## 2023-12-17 DIAGNOSIS — I10 Essential (primary) hypertension: Secondary | ICD-10-CM

## 2023-12-18 MED ORDER — VALSARTAN 160 MG PO TABS
160.0000 mg | ORAL_TABLET | Freq: Every day | ORAL | 0 refills | Status: DC
Start: 2023-12-18 — End: 2024-05-18

## 2023-12-18 NOTE — Addendum Note (Signed)
 Addended by: Raina Bunting on: 12/18/2023 01:27 PM   Modules accepted: Orders

## 2024-01-28 ENCOUNTER — Ambulatory Visit (INDEPENDENT_AMBULATORY_CARE_PROVIDER_SITE_OTHER)

## 2024-01-28 VITALS — BP 172/90 | HR 89 | Ht 66.0 in | Wt 227.1 lb

## 2024-01-28 DIAGNOSIS — M5412 Radiculopathy, cervical region: Secondary | ICD-10-CM | POA: Diagnosis not present

## 2024-01-28 DIAGNOSIS — I10 Essential (primary) hypertension: Secondary | ICD-10-CM

## 2024-01-28 NOTE — Progress Notes (Signed)
   Acute Office Visit  Subjective:     Patient ID: Cody Harrison, male    DOB: [birth year], 48 y.o.   MRN: [id number]  Chief Complaint  Patient presents with   Medication Problem    Left arm has felt painful since starting valsartin on 12/24/23.    HPI Patient is in today for Patient seen for a follow-up visit for his hypertension.  He had been started on valsartan  back in May 160 mg daily.  He is concerned because his blood pressure he feels has been running high at home.  His blood pressure log was brought showing a number of values ranging from 168 systolic down to 886 and diastolic pressures generally in the normal ranges at times as high as in the 90s.  Otherwise he is having some arm pain of the left arm radiating from the neck down the posterior side of the arm to his fingertips with numbness.  ROS   Constitutional:  Negative for chills, fever and weight loss.  Eyes:  Negative for blurred vision.  Respiratory:  Negative for cough and shortness of breath.   Cardiovascular:  Negative for chest pain and palpitations.   Psychiatric/Behavioral:  Negative for depression. The patient is not nervous/anxious.        Objective:    BP (!) 172/90 (BP Location: Left Arm, Patient Position: Sitting, Cuff Size: Normal)   Pulse 89   Ht 5' 6 (1.676 m)   Wt 227 lb 2 oz (103 kg)   SpO2 99%   BMI 36.66 kg/m    Physical Exam  No results found for any visits on 01/28/24. Physical Exam Vitals reviewed.  Constitutional:      Appearance: Normal appearance. Well-developed with normal weight.  Cardiovascular:     Rate and Rhythm: Normal rate and regular rhythm. Normal heart sounds. Normal peripheral pulses Pulmonary:     Normal breath sounds with normal effort Skin:    General: Skin is warm and dry without noticeable rash. Neurological:     General: No focal deficit present.  Psychiatric:        Mood and Affect: Mood, behavior and cognition normal   No neck pain with movement or  palpation    Assessment & Plan:  #1 hypertension Discussed with patient how to take his blood pressure at home.  He should sit for at least 10 to 15 minutes before taking his blood pressure seems that he is out doing things and that comes in him and checks his blood pressure immediately.  Avoid taking blood pressure first thing in the morning.  Continue with current dose and follow-up with blood pressure log in 2 weeks.  2.  Cervical radiculopathy.  Did give patient exercises for cervical radiculopathy.  Follow-up if no improvement. Problem List Items Addressed This Visit   None   No orders of the defined types were placed in this encounter.   Fu PRN or if no improvment  Mahesh Sizemore A Aslan Himes

## 2024-05-18 ENCOUNTER — Encounter: Payer: Self-pay | Admitting: Family Medicine

## 2024-05-18 ENCOUNTER — Other Ambulatory Visit: Payer: Self-pay | Admitting: Family Medicine

## 2024-05-18 ENCOUNTER — Ambulatory Visit: Admitting: Family Medicine

## 2024-05-18 VITALS — BP 162/80 | HR 99 | Ht 66.0 in | Wt 231.4 lb

## 2024-05-18 DIAGNOSIS — I10 Essential (primary) hypertension: Secondary | ICD-10-CM | POA: Diagnosis not present

## 2024-05-18 DIAGNOSIS — G2581 Restless legs syndrome: Secondary | ICD-10-CM | POA: Diagnosis not present

## 2024-05-18 DIAGNOSIS — H6123 Impacted cerumen, bilateral: Secondary | ICD-10-CM

## 2024-05-18 DIAGNOSIS — E1169 Type 2 diabetes mellitus with other specified complication: Secondary | ICD-10-CM | POA: Diagnosis not present

## 2024-05-18 DIAGNOSIS — Z23 Encounter for immunization: Secondary | ICD-10-CM | POA: Diagnosis not present

## 2024-05-18 LAB — POCT GLYCOSYLATED HEMOGLOBIN (HGB A1C): Hemoglobin A1C: 6.6 % — AB (ref 4.0–5.6)

## 2024-05-18 MED ORDER — HYDROCHLOROTHIAZIDE 25 MG PO TABS: 25.0000 mg | ORAL_TABLET | Freq: Every day | ORAL | 0 refills | Status: DC

## 2024-05-18 NOTE — Patient Instructions (Addendum)
 Thank you for coming to the office today.  Message which muscle relaxant works for you the name and dosage and we can order for restless leg  Start hydrochlorothiazide 25mg  daily We can repeat a blood test in the future to check kidneys  Try to stay well hydrated  Remain off older BP medications  Recent Labs    11/10/23 0801 05/18/24 1339  HGBA1C 6.1* 6.6*    Okay to call them to schedule  You have been referred for a Coronary Calcium Score Cardiac CT Scan. This is a screening test for patients aged 76-50+ with cardiovascular risk factors or who are healthy but would be interested in Cardiovascular Screening for heart disease. Even if there is a family history of heart disease, this imaging can be useful. Typically it can be done every 5+ years or at a different timeline we agree on  The scan will look at the chest and mainly focus on the heart and identify early signs of calcium build up or blockages within the heart arteries. It is not 100% accurate for identifying blockages or heart disease, but it is useful to help us  predict who may have some early changes or be at risk in the future for a heart attack or cardiovascular problem.  The results are reviewed by a Cardiologist and they will document the results. It should become available on MyChart. Typically the results are divided into percentiles based on other patients of the same demographic and age. So it will compare your risk to others similar to you. If you have a higher score >99 or higher percentile >75%tile, it is recommended to consider Statin cholesterol therapy and or referral to Cardiologist. I will try to help explain your results and if we have questions we can contact the Cardiologist.  You will be contacted for scheduling. Usually it is done at any imaging facility through [facility], [facility] or [facility].  The cost is $99 flat fee total and it does not go through insurance, so  no authorization is required.   Please schedule a Follow-up Appointment to: Return in about 3 months (around 08/18/2024) for 3 month fasting lab > 1 week later HTN, Diabetes updates.  If you have any other questions or concerns, please feel free to call the office or send a message through MyChart. You may also schedule an earlier appointment if necessary.  Additionally, you may be receiving a survey about your experience at our office within a few days to 1 week by e-mail or mail. We value your feedback.  Marsa Officer, DO [facility], [place]

## 2024-05-18 NOTE — Progress Notes (Signed)
 Subjective:    Patient ID: Cody Harrison, male    DOB: 1975-12-07, 48 y.o.   MRN: [id number]  Cody Harrison is a 48 y.o. male presenting on 05/18/2024 for Medical Management of Chronic Issues, Diabetes, and Hypertension   HPI  Discussed the use of AI scribe software for clinical note transcription with the patient, who gave verbal consent to proceed.  History of Present Illness   Cody Harrison is a 48 year old male with hypertension who presents with concerns about blood pressure management and medication side effects.  Hypertension Hypertension management and medication adverse effects - Ongoing difficulty with blood pressure control. - Initially started on amlodipine  5 mg in April, which caused a patchy red rash on forearms, occasionally pruritic. - Discontinued amlodipine  due to rash and switched to valsartan  160mg , intolerance and side effects, ineffective - Blood pressure readings on valsartan  averaged 150-158/110 mmHg; perceived worsening of hypertension. - Discontinued valsartan  due to inadequate blood pressure control.  - Currently not taking any antihypertensive medication. - Home blood pressure measurements range from 150-180 mmHg systolic and 110 mmHg diastolic. - Interested in other treatment options  Restless leg symptoms RLS - Occasional episodes of restless legs. - Previously self-administered muscle relaxers prescribed to his father; unable to recall medication name or dosage. - Plans to provide medication details at a later date with name - Failed Gabapentin  in past.  Bilateral Ear Cerumen Impaction, hearing loss - Recent onset of ear blockage and hearing loss in one ear. - Attempted treatment with over-the-counter ear wax removal drops without improvement. - Ear was pruritic prior to development of blockage.  Type 2 Diabetes Glycemic control - Blood sugar levels reportedly stable. - Recent blood glucose reading of 6.6 on recent labs. - Not  currently taking any medication for diabetes.       Health Maintenance: Flu Shot today     05/18/2024    7:31 PM 01/28/2024    2:46 PM 11/17/2023    2:30 PM  Depression screen PHQ 2/9  Decreased Interest 1 1 1   Down, Depressed, Hopeless 1 1 1   PHQ - 2 Score 2 2 2   Altered sleeping 2 2 3   Tired, decreased energy 1 1 2   Change in appetite 1 1 0  Feeling bad or failure about yourself  1 1 0  Trouble concentrating 1 1 0  Moving slowly or fidgety/restless 1 1 0  Suicidal thoughts 0 0 0  PHQ-9 Score 9 9 7   Difficult doing work/chores Somewhat difficult Somewhat difficult Very difficult       01/28/2024    2:46 PM 11/17/2023    2:30 PM 05/12/2023    2:58 PM 01/01/2023    4:01 PM  GAD 7 : Generalized Anxiety Score  Nervous, Anxious, on Edge 1 2 1 3   Control/stop worrying 1 2 1 1   Worry too much - different things 1 2 2 2   Trouble relaxing 2 2 2 2   Restless 1 2 2 2   Easily annoyed or irritable 2 2 1 2   Afraid - awful might happen 1 1 1 2   Total GAD 7 Score 9 13 10 14   Anxiety Difficulty  Very difficult      Social History   Tobacco Use   Smoking status: Never   Smokeless tobacco: Current    Types: Chew   Tobacco comments:    30 years, chewing tobacco  Vaping Use   Vaping status: Never Used  Substance Use Topics  Alcohol use: No    Comment: Last Drink- 02/15/16 (Prior Heavy Use)   Drug use: Yes    Types: Marijuana    Comment: Regular use for pain control and sleep. Last Use- 06/06/16    Review of Systems Per HPI unless specifically indicated above     Objective:    BP (!) 162/80 (BP Location: Left Arm, Cuff Size: Normal)   Pulse 99   Ht 5' 6 (1.676 m)   Wt 231 lb 6 oz (105 kg)   SpO2 98%   BMI 37.34 kg/m   Wt Readings from Last 3 Encounters:  05/18/24 231 lb 6 oz (105 kg)  01/28/24 227 lb 2 oz (103 kg)  11/17/23 216 lb (98 kg)    Physical Exam Vitals and nursing note reviewed.  Constitutional:      General: He is not in acute distress.     Appearance: Normal appearance. He is well-developed. He is not diaphoretic.     Comments: Well-appearing, comfortable, cooperative  HENT:     Head: Normocephalic and atraumatic.     Right Ear: There is impacted cerumen.     Left Ear: There is impacted cerumen.  Eyes:     General:        Right eye: No discharge.        Left eye: No discharge.     Conjunctiva/sclera: Conjunctivae normal.  Cardiovascular:     Rate and Rhythm: Normal rate.  Pulmonary:     Effort: Pulmonary effort is normal.  Skin:    General: Skin is warm and dry.     Findings: No erythema or rash.  Neurological:     Mental Status: He is alert and oriented to person, place, and time.  Psychiatric:        Mood and Affect: Mood normal.        Behavior: Behavior normal.        Thought Content: Thought content normal.     Comments: Well groomed, good eye contact, normal speech and thoughts    ________________________________________________________ PROCEDURE NOTE Date: 05/18/24 Bilateral Ear Lavage / Cerumen Removal Discussed benefits and risks (including pain / discomforts, dizziness, minor abrasion of ear canal). Verbal consent given by patient. Medication:  carbamide peroxide ear drops, Ear Lavage Solution (warm water + hydrogen peroxide) Performed by Dr Edman and Alan Fontana CMA Several drops of carbamide peroxide placed in each ear, allowed to sit for few minutes. Ear lavage solution flushed into one ear at a time in attempt to dislodge and remove ear wax. Results successful  Repeat Ear Exam: - Completely removed cerumen now, with clear ear canals and visible TMs clear and normal appearance.    Diabetic Foot Exam - Simple   Simple Foot Form Diabetic Foot exam was performed with the following findings: Yes 05/18/2024  7:27 PM  Visual Inspection No deformities, no ulcerations, no other skin breakdown bilaterally: Yes Sensation Testing Intact to touch and monofilament testing bilaterally: Yes Pulse  Check Posterior Tibialis and Dorsalis pulse intact bilaterally: Yes Comments      Results for orders placed or performed in visit on 05/18/24  POCT HgB A1C   Collection Time: 05/18/24  1:39 PM  Result Value Ref Range   Hemoglobin A1C 6.6 (A) 4.0 - 5.6 %   HbA1c POC (<> result, manual entry)     HbA1c, POC (prediabetic range)     HbA1c, POC (controlled diabetic range)        Assessment & Plan:   Problem  List Items Addressed This Visit     Essential hypertension   Relevant Medications   hydrochlorothiazide (HYDRODIURIL) 25 MG tablet   Restless legs syndrome (RLS)   Type 2 diabetes mellitus with other specified complication (HCC) - Primary   Relevant Orders   POCT HgB A1C (Completed)   Other Visit Diagnoses       Flu vaccine need       Relevant Orders   Flu vaccine trivalent PF, 6mos and older(Flulaval,Afluria,Fluarix,Fluzone) (Completed)     Bilateral impacted cerumen [H61.23]            Essential hypertension Persistent elevated blood pressure averaging 150-180/110 mmHg. Previous medications ineffective with adverse effects. Failed Amlodipine  5mg  due to rash, Valsartan  160mg  due to general side effects  Next class of med for HYPERTENSION Thiazide - Start hydrochlorothiazide 25 mg daily. - Monitor blood chemistry in 3 months for kidney function and electrolytes. - Encourage hydration to counteract diuretic fluid loss. - Encourage home BP readings - Goal < 140 / 90 - Reassess blood pressure in 3 months.  Type 2 diabetes mellitus Blood sugar levels well-controlled at 6.6% without medication. Request DM Eye DM Foot Urine micro already done  Morbid Obesity BMI >37 Complication with diabetes, hypertension Encourage weight management strategies   Restless legs syndrome Intermittent symptoms managed off-label with muscle relaxants. Gabapentin  not tolerated. - He will message the clinic with the name and dosage of the effective muscle relaxant for  prescription. Agree to order muscle relaxant Future consider Ropinirole  Cerumen impaction, bilateral Bilateral earwax buildup causing hearing impairment. Over-the-counter drops ineffective. Ear irrigation performed. - Advise on potential use of ear drops monthly to prevent future buildup.       Orders Placed This Encounter  Procedures   Flu vaccine trivalent PF, 6mos and older(Flulaval,Afluria,Fluarix,Fluzone)   POCT HgB A1C    Meds ordered this encounter  Medications   hydrochlorothiazide (HYDRODIURIL) 25 MG tablet    Sig: Take 1 tablet (25 mg total) by mouth daily.    Dispense:  30 tablet    Refill:  0    Follow up plan: Return in about 3 months (around 08/18/2024) for 3 month fasting lab > 1 week later HTN, Diabetes updates.  Future labs ordered for 08/18/24  Marsa Officer, DO [facility] 05/18/2024, 1:40 PM

## 2024-08-13 ENCOUNTER — Other Ambulatory Visit

## 2024-08-13 DIAGNOSIS — I10 Essential (primary) hypertension: Secondary | ICD-10-CM

## 2024-08-13 DIAGNOSIS — E1169 Type 2 diabetes mellitus with other specified complication: Secondary | ICD-10-CM

## 2024-08-13 LAB — BASIC METABOLIC PANEL WITH GFR
BUN/Creatinine Ratio: 18 (calc) (ref 6–22)
BUN: 10 mg/dL (ref 7–25)
CO2: 24 mmol/L (ref 20–32)
Calcium: 8.9 mg/dL (ref 8.6–10.3)
Chloride: 102 mmol/L (ref 98–110)
Creat: 0.55 mg/dL — ABNORMAL LOW (ref 0.70–1.30)
Glucose, Bld: 304 mg/dL — ABNORMAL HIGH (ref 65–99)
Potassium: 4 mmol/L (ref 3.5–5.3)
Sodium: 137 mmol/L (ref 135–146)
eGFR: 121 mL/min/1.73m2

## 2024-08-13 LAB — HEMOGLOBIN A1C
Hgb A1c MFr Bld: 9.9 % — ABNORMAL HIGH
Mean Plasma Glucose: 237 mg/dL
eAG (mmol/L): 13.2 mmol/L

## 2024-08-17 ENCOUNTER — Other Ambulatory Visit: Payer: Self-pay | Admitting: Family Medicine

## 2024-08-17 DIAGNOSIS — E1169 Type 2 diabetes mellitus with other specified complication: Secondary | ICD-10-CM

## 2024-08-17 DIAGNOSIS — I10 Essential (primary) hypertension: Secondary | ICD-10-CM

## 2024-08-18 ENCOUNTER — Other Ambulatory Visit

## 2024-08-25 ENCOUNTER — Encounter: Payer: Self-pay | Admitting: Family Medicine

## 2024-08-25 ENCOUNTER — Ambulatory Visit: Admitting: Family Medicine

## 2024-08-25 VITALS — BP 170/100 | HR 105 | Ht 66.0 in | Wt 226.2 lb

## 2024-08-25 DIAGNOSIS — I10 Essential (primary) hypertension: Secondary | ICD-10-CM

## 2024-08-25 DIAGNOSIS — E782 Mixed hyperlipidemia: Secondary | ICD-10-CM

## 2024-08-25 DIAGNOSIS — F4310 Post-traumatic stress disorder, unspecified: Secondary | ICD-10-CM

## 2024-08-25 DIAGNOSIS — F1021 Alcohol dependence, in remission: Secondary | ICD-10-CM

## 2024-08-25 DIAGNOSIS — Z87820 Personal history of traumatic brain injury: Secondary | ICD-10-CM

## 2024-08-25 DIAGNOSIS — E1165 Type 2 diabetes mellitus with hyperglycemia: Secondary | ICD-10-CM

## 2024-08-25 DIAGNOSIS — R1031 Right lower quadrant pain: Secondary | ICD-10-CM

## 2024-08-25 MED ORDER — CHLORTHALIDONE 25 MG PO TABS: 25.0000 mg | ORAL_TABLET | Freq: Every day | ORAL | 0 refills | Status: AC

## 2024-08-25 NOTE — Patient Instructions (Addendum)
 Thank you for coming to the office today.  Recent Labs    11/10/23 0801 05/18/24 1339 08/13/24 0934  HGBA1C 6.1* 6.6* 9.9*    [email or web address] for telehealth nutritionist  Future options for medication if the sugar doesn't improve  Metformin  pills or injectable / oral for the Ozempic, Mounjaro, Rybelsus  New rx for BP - Start Chlorthalidone  25mg  daily, it is diuretic fluid medication similar to last one but stronger\  Likely high BP due to poor sleep and pain  Please schedule a Follow-up Appointment to: Return in about 3 months (around 11/22/2024) for 3 month DM A1c, HTN, Pain, Weight.  If you have any other questions or concerns, please feel free to call the office or send a message through MyChart. You may also schedule an earlier appointment if necessary.  Additionally, you may be receiving a survey about your experience at our office within a few days to 1 week by e-mail or mail. We value your feedback.  Marsa Officer, DO [facility], [place]
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
## 2024-11-22 ENCOUNTER — Ambulatory Visit: Admitting: Family Medicine
